# Patient Record
Sex: Female | Born: 1965 | Race: White | Hispanic: No | Marital: Married | Smoking: Never smoker
Health system: Southern US, Community
[De-identification: ages and names within clinical notes are randomized; demographics above are authoritative.]

## PROBLEM LIST (undated history)

## (undated) DIAGNOSIS — I890 Lymphedema, not elsewhere classified: Secondary | ICD-10-CM

## (undated) DIAGNOSIS — F988 Other specified behavioral and emotional disorders with onset usually occurring in childhood and adolescence: Secondary | ICD-10-CM

## (undated) DIAGNOSIS — M5430 Sciatica, unspecified side: Secondary | ICD-10-CM

## (undated) DIAGNOSIS — K859 Acute pancreatitis without necrosis or infection, unspecified: Secondary | ICD-10-CM

## (undated) DIAGNOSIS — I2699 Other pulmonary embolism without acute cor pulmonale: Secondary | ICD-10-CM

## (undated) DIAGNOSIS — G43909 Migraine, unspecified, not intractable, without status migrainosus: Secondary | ICD-10-CM

## (undated) DIAGNOSIS — F419 Anxiety disorder, unspecified: Secondary | ICD-10-CM

## (undated) DIAGNOSIS — G629 Polyneuropathy, unspecified: Secondary | ICD-10-CM

## (undated) DIAGNOSIS — E119 Type 2 diabetes mellitus without complications: Secondary | ICD-10-CM

## (undated) HISTORY — DX: Acute pancreatitis without necrosis or infection, unspecified: K85.90

## (undated) HISTORY — PX: PANCREATECTOMY: SHX1019

## (undated) HISTORY — PX: BREAST SURGERY: SHX581

## (undated) HISTORY — DX: Polyneuropathy, unspecified: G62.9

## (undated) HISTORY — PX: ABDOMINAL SURGERY: SHX537

## (undated) HISTORY — PX: SPLENECTOMY, TOTAL: SHX788

## (undated) HISTORY — PX: KIDNEY STONE SURGERY: SHX686

## (undated) HISTORY — DX: Migraine, unspecified, not intractable, without status migrainosus: G43.909

## (undated) HISTORY — PX: CHOLECYSTECTOMY: SHX55

## (undated) HISTORY — DX: Other pulmonary embolism without acute cor pulmonale: I26.99

---

## 2010-05-01 DIAGNOSIS — K859 Acute pancreatitis without necrosis or infection, unspecified: Secondary | ICD-10-CM

## 2010-05-01 HISTORY — DX: Acute pancreatitis without necrosis or infection, unspecified: K85.90

## 2010-10-01 DIAGNOSIS — G629 Polyneuropathy, unspecified: Secondary | ICD-10-CM

## 2010-10-01 DIAGNOSIS — I2699 Other pulmonary embolism without acute cor pulmonale: Secondary | ICD-10-CM

## 2010-10-01 HISTORY — DX: Polyneuropathy, unspecified: G62.9

## 2010-10-01 HISTORY — DX: Other pulmonary embolism without acute cor pulmonale: I26.99

## 2012-08-05 ENCOUNTER — Ambulatory Visit (INDEPENDENT_AMBULATORY_CARE_PROVIDER_SITE_OTHER): Payer: BC Managed Care – PPO | Admitting: Psychiatry

## 2012-08-05 ENCOUNTER — Encounter (HOSPITAL_COMMUNITY): Payer: Self-pay | Admitting: Psychiatry

## 2012-08-05 VITALS — BP 116/80 | HR 104 | Ht 60.0 in | Wt 170.0 lb

## 2012-08-05 DIAGNOSIS — F1920 Other psychoactive substance dependence, uncomplicated: Secondary | ICD-10-CM

## 2012-08-05 DIAGNOSIS — F411 Generalized anxiety disorder: Secondary | ICD-10-CM
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
## 2012-08-13 ENCOUNTER — Encounter (HOSPITAL_COMMUNITY): Payer: Self-pay | Admitting: Psychiatry

## 2012-08-13 DIAGNOSIS — F411 Generalized anxiety disorder: Secondary | ICD-10-CM | POA: Insufficient documentation

## 2012-08-13 DIAGNOSIS — F1920 Other psychoactive substance dependence, uncomplicated: Secondary | ICD-10-CM | POA: Insufficient documentation

## 2012-08-26 ENCOUNTER — Ambulatory Visit (INDEPENDENT_AMBULATORY_CARE_PROVIDER_SITE_OTHER): Payer: BC Managed Care – PPO | Admitting: Psychiatry

## 2012-08-26 ENCOUNTER — Encounter (HOSPITAL_COMMUNITY): Payer: Self-pay | Admitting: Psychiatry

## 2012-08-26 VITALS — BP 116/80 | HR 109 | Ht 60.0 in | Wt 170.0 lb

## 2012-08-26 DIAGNOSIS — F1920 Other psychoactive substance dependence, uncomplicated: Secondary | ICD-10-CM

## 2012-08-26 DIAGNOSIS — F1921 Other psychoactive substance dependence, in remission: Secondary | ICD-10-CM

## 2012-08-26 DIAGNOSIS — F411 Generalized anxiety disorder: Secondary | ICD-10-CM
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
## 2014-03-29 ENCOUNTER — Ambulatory Visit: Payer: BC Managed Care – PPO | Admitting: Physician Assistant

## 2015-09-14 ENCOUNTER — Emergency Department
Admission: EM | Admit: 2015-09-14 | Discharge: 2015-09-14 | Disposition: A | Discharge status: Home / Self Care | Payer: Medicare HMO | Source: Home / Self Care | Attending: Family Medicine | Admitting: Family Medicine

## 2015-09-14 ENCOUNTER — Emergency Department (INDEPENDENT_AMBULATORY_CARE_PROVIDER_SITE_OTHER): Payer: Medicare HMO

## 2015-09-14 ENCOUNTER — Ambulatory Visit (INDEPENDENT_AMBULATORY_CARE_PROVIDER_SITE_OTHER): Payer: Medicare HMO | Admitting: Family Medicine

## 2015-09-14 ENCOUNTER — Encounter: Payer: Self-pay | Admitting: *Deleted

## 2015-09-14 DIAGNOSIS — S8261XA Displaced fracture of lateral malleolus of right fibula, initial encounter for closed fracture: Secondary | ICD-10-CM

## 2015-09-14 DIAGNOSIS — S93401A Sprain of unspecified ligament of right ankle, initial encounter: Secondary | ICD-10-CM

## 2015-09-14 DIAGNOSIS — M7731 Calcaneal spur, right foot: Secondary | ICD-10-CM

## 2015-09-14 HISTORY — DX: Anxiety disorder, unspecified: F41.9

## 2015-09-14 HISTORY — DX: Lymphedema, not elsewhere classified: I89.0

## 2015-09-14 HISTORY — DX: Sciatica, unspecified side: M54.30

## 2015-09-14 HISTORY — DX: Other specified behavioral and emotional disorders with onset usually occurring in childhood and adolescence: F98.8

## 2015-09-14 HISTORY — DX: Type 2 diabetes mellitus without complications: E11.9

## 2015-09-14 NOTE — Patient Instructions (Signed)
Thank you for coming in today. Use the boot.  Return to me in about 1-2 weeks.  Use crutches as needed.   Take aleve for pain as needed.   Acute Ankle Sprain With Phase I Rehab An acute ankle sprain is a partial or complete tear in one or more of the ligaments of the ankle due to traumatic injury. The severity of the injury depends on both the number of ligaments sprained and the grade of sprain. There are 3 grades of sprains.   A grade 1 sprain is a mild sprain. There is a slight pull without obvious tearing. There is no loss of strength, and the muscle and ligament are the correct length.  A grade 2 sprain is a moderate sprain. There is tearing of fibers within the substance of the ligament where it connects two bones or two cartilages. The length of the ligament is increased, and there is usually decreased strength.  A grade 3 sprain is a complete rupture of the ligament and is uncommon. In addition to the grade of sprain, there are three types of ankle sprains.  Lateral ankle sprains: This is a sprain of one or more of the three ligaments on the outer side (lateral) of the ankle. These are the most common sprains. Medial ankle sprains: There is one large triangular ligament of the inner side (medial) of the ankle that is susceptible to injury. Medial ankle sprains are less common. Syndesmosis, "high ankle," sprains: The syndesmosis is the ligament that connects the two bones of the lower leg. Syndesmosis sprains usually only occur with very severe ankle sprains. SYMPTOMS  Pain, tenderness, and swelling in the ankle, starting at the side of injury that may progress to the whole ankle and foot with time.  "Pop" or tearing sensation at the time of injury.  Bruising that may spread to the heel.  Impaired ability to walk soon after injury. CAUSES   Acute ankle sprains are caused by trauma placed on the ankle that temporarily forces or pries the anklebone (talus) out of its normal  socket.  Stretching or tearing of the ligaments that normally hold the joint in place (usually due to a twisting injury). RISK INCREASES WITH:  Previous ankle sprain.  Sports in which the foot may land awkwardly (i.e., basketball, volleyball, or soccer) or walking or running on uneven or rough surfaces.  Shoes with inadequate support to prevent sideways motion when stress occurs.  Poor strength and flexibility.  Poor balance skills.  Contact sports. PREVENTION   Warm up and stretch properly before activity.  Maintain physical fitness:  Ankle and leg flexibility, muscle strength, and endurance.  Cardiovascular fitness.  Balance training activities.  Use proper technique and have a coach correct improper technique.  Taping, protective strapping, bracing, or high-top tennis shoes may help prevent injury. Initially, tape is best; however, it loses most of its support function within 10 to 15 minutes.  Wear proper-fitted protective shoes (High-top shoes with taping or bracing is more effective than either alone).  Provide the ankle with support during sports and practice activities for 12 months following injury. PROGNOSIS   If treated properly, ankle sprains can be expected to recover completely; however, the length of recovery depends on the degree of injury.  A grade 1 sprain usually heals enough in 5 to 7 days to allow modified activity and requires an average of 6 weeks to heal completely.  A grade 2 sprain requires 6 to 10 weeks to heal completely.  A grade 3 sprain requires 12 to 16 weeks to heal.  A syndesmosis sprain often takes more than 3 months to heal. RELATED COMPLICATIONS   Frequent recurrence of symptoms may result in a chronic problem. Appropriately addressing the problem the first time decreases the frequency of recurrence and optimizes healing time. Severity of the initial sprain does not predict the likelihood of later instability.  Injury to other  structures (bone, cartilage, or tendon).  A chronically unstable or arthritic ankle joint is a possibility with repeated sprains. TREATMENT Treatment initially involves the use of ice, medication, and compression bandages to help reduce pain and inflammation. Ankle sprains are usually immobilized in a walking cast or boot to allow for healing. Crutches may be recommended to reduce pressure on the injury. After immobilization, strengthening and stretching exercises may be necessary to regain strength and a full range of motion. Surgery is rarely needed to treat ankle sprains. MEDICATION   Nonsteroidal anti-inflammatory medications, such as aspirin and ibuprofen (do not take for the first 3 days after injury or within 7 days before surgery), or other minor pain relievers, such as acetaminophen, are often recommended. Take these as directed by your caregiver. Contact your caregiver immediately if any bleeding, stomach upset, or signs of an allergic reaction occur from these medications.  Ointments applied to the skin may be helpful.  Pain relievers may be prescribed as necessary by your caregiver. Do not take prescription pain medication for longer than 4 to 7 days. Use only as directed and only as much as you need. HEAT AND COLD  Cold treatment (icing) is used to relieve pain and reduce inflammation for acute and chronic cases. Cold should be applied for 10 to 15 minutes every 2 to 3 hours for inflammation and pain and immediately after any activity that aggravates your symptoms. Use ice packs or an ice massage.  Heat treatment may be used before performing stretching and strengthening activities prescribed by your caregiver. Use a heat pack or a warm soak. SEEK IMMEDIATE MEDICAL CARE IF:   Pain, swelling, or bruising worsens despite treatment.  You experience pain, numbness, discoloration, or coldness in the foot or toes.  New, unexplained symptoms develop (drugs used in treatment may produce  side effects.) EXERCISES  PHASE I EXERCISES RANGE OF MOTION (ROM) AND STRETCHING EXERCISES - Ankle Sprain, Acute Phase I, Weeks 1 to 2 These exercises may help you when beginning to restore flexibility in your ankle. You will likely work on these exercises for the 1 to 2 weeks after your injury. Once your physician, physical therapist, or athletic trainer sees adequate progress, he or she will advance your exercises. While completing these exercises, remember:   Restoring tissue flexibility helps normal motion to return to the joints. This allows healthier, less painful movement and activity.  An effective stretch should be held for at least 30 seconds.  A stretch should never be painful. You should only feel a gentle lengthening or release in the stretched tissue. RANGE OF MOTION - Dorsi/Plantar Flexion  While sitting with your right / left knee straight, draw the top of your foot upwards by flexing your ankle. Then reverse the motion, pointing your toes downward.  Hold each position for __________ seconds.  After completing your first set of exercises, repeat this exercise with your knee bent. Repeat __________ times. Complete this exercise __________ times per day.  RANGE OF MOTION - Ankle Alphabet  Imagine your right / left big toe is a pen.  Keeping  your hip and knee still, write out the entire alphabet with your "pen." Make the letters as large as you can without increasing any discomfort. Repeat __________ times. Complete this exercise __________ times per day.  STRENGTHENING EXERCISES - Ankle Sprain, Acute -Phase I, Weeks 1 to 2 These exercises may help you when beginning to restore strength in your ankle. You will likely work on these exercises for 1 to 2 weeks after your injury. Once your physician, physical therapist, or athletic trainer sees adequate progress, he or she will advance your exercises. While completing these exercises, remember:   Muscles can gain both the  endurance and the strength needed for everyday activities through controlled exercises.  Complete these exercises as instructed by your physician, physical therapist, or athletic trainer. Progress the resistance and repetitions only as guided.  You may experience muscle soreness or fatigue, but the pain or discomfort you are trying to eliminate should never worsen during these exercises. If this pain does worsen, stop and make certain you are following the directions exactly. If the pain is still present after adjustments, discontinue the exercise until you can discuss the trouble with your clinician. STRENGTH - Dorsiflexors  Secure a rubber exercise band/tubing to a fixed object (i.e., table, pole) and loop the other end around your right / left foot.  Sit on the floor facing the fixed object. The band/tubing should be slightly tense when your foot is relaxed.  Slowly draw your foot back toward you using your ankle and toes.  Hold this position for __________ seconds. Slowly release the tension in the band and return your foot to the starting position. Repeat __________ times. Complete this exercise __________ times per day.  STRENGTH - Plantar-flexors   Sit with your right / left leg extended. Holding onto both ends of a rubber exercise band/tubing, loop it around the ball of your foot. Keep a slight tension in the band.  Slowly push your toes away from you, pointing them downward.  Hold this position for __________ seconds. Return slowly, controlling the tension in the band/tubing. Repeat __________ times. Complete this exercise __________ times per day.  STRENGTH - Ankle Eversion  Secure one end of a rubber exercise band/tubing to a fixed object (table, pole). Loop the other end around your foot just before your toes.  Place your fists between your knees. This will focus your strengthening at your ankle.  Drawing the band/tubing across your opposite foot, slowly, pull your little toe  out and up. Make sure the band/tubing is positioned to resist the entire motion.  Hold this position for __________ seconds. Have your muscles resist the band/tubing as it slowly pulls your foot back to the starting position.  Repeat __________ times. Complete this exercise __________ times per day.  STRENGTH - Ankle Inversion  Secure one end of a rubber exercise band/tubing to a fixed object (table, pole). Loop the other end around your foot just before your toes.  Place your fists between your knees. This will focus your strengthening at your ankle.  Slowly, pull your big toe up and in, making sure the band/tubing is positioned to resist the entire motion.  Hold this position for __________ seconds.  Have your muscles resist the band/tubing as it slowly pulls your foot back to the starting position. Repeat __________ times. Complete this exercises __________ times per day.  STRENGTH - Towel Curls  Sit in a chair positioned on a non-carpeted surface.  Place your right / left foot on a towel,  keeping your heel on the floor.  Pull the towel toward your heel by only curling your toes. Keep your heel on the floor.  If instructed by your physician, physical therapist, or athletic trainer, add weight to the end of the towel. Repeat __________ times. Complete this exercise __________ times per day.   This information is not intended to replace advice given to you by your health care provider. Make sure you discuss any questions you have with your health care provider.   Document Released: 04/18/2005 Document Revised: 10/08/2014 Document Reviewed: 12/30/2008 Elsevier Interactive Patient Education [employer]

## 2015-09-14 NOTE — ED Provider Notes (Signed)
CSN: [id number]     Arrival date & time 09/14/15  [id number] History   First MD Initiated Contact with Patient 09/14/15 1009     Chief Complaint  Patient presents with  . Ankle Injury      HPI Comments: Patient tripped on two small stairs yesterday, injuring her right ankle.  She has had persistent pain/swelling.  Patient is a 49 y.o. female presenting with ankle pain. The history is provided by the patient.  Ankle Pain Location:  Ankle Time since incident:  1 day Injury: yes   Mechanism of injury: fall   Fall:    Fall occurred:  Down stairs   Impact surface:  Hard floor Ankle location:  R ankle Pain details:    Quality:  Aching   Radiates to:  R leg   Severity:  Moderate   Onset quality:  Sudden   Duration:  1 day   Timing:  Constant   Progression:  Unchanged Chronicity:  New Prior injury to area:  No Relieved by:  None tried Worsened by:  Bearing weight Ineffective treatments:  None tried Associated symptoms: decreased ROM, numbness, stiffness, swelling and tingling   Associated symptoms: no back pain and no muscle weakness     Past Medical History  Diagnosis Date  . Pancreatitis Aug 2011  . Migraines   . Neuropathy (HCC) 2012  . Pulmonary embolism (HCC) 10/2010  . Diabetes mellitus without complication (HCC)   . Anxiety   . Lymphedema   . ADD (attention deficit disorder)   . Sciatica    Past Surgical History  Procedure Laterality Date  . Pancreatectomy    . Abdominal surgery    . Kidney stone surgery    . Splenectomy, total    . Cholecystectomy    . Pancreatectomy      partial  . Breast surgery     Family History  Problem Relation Age of Onset  . Alcohol abuse Father   . Drug abuse Father   . Dementia Maternal Grandfather   . Diabetes Mellitus II Paternal Grandfather    Social History  Substance Use Topics  . Smoking status: Never Smoker   . Smokeless tobacco: None  . Alcohol Use: No     Comment: Last drink Aug. 24, 2011. She was drinking 2-3  bottles of wine a day.   OB History    No data available     Review of Systems  Musculoskeletal: Positive for stiffness. Negative for back pain.  All other systems reviewed and are negative.   Allergies  Acetaminophen  Home Medications   Prior to Admission medications   Medication Sig Start Date End Date Taking? Authorizing Provider  ALPRAZolam Prudy Feeler(XANAX) 1 MG tablet Take 1 mg by mouth Daily.  07/13/12  Yes Historical Provider, MD  amitriptyline (ELAVIL) 75 MG tablet Take 75 mg by mouth at bedtime.   Yes Historical Provider, MD  buprenorphine (SUBUTEX) 8 MG SUBL SL tablet Place under the tongue daily.   Yes Historical Provider, MD  CREON 12000 UNITS CPEP Take 1 capsule by mouth 3 (three) times daily with meals.  06/16/12  Yes Historical Provider, MD  metFORMIN (GLUCOPHAGE) 500 MG tablet Take by mouth 2 (two) times daily with a meal.   Yes Historical Provider, MD  ondansetron (ZOFRAN) 4 MG tablet Take 4 mg by mouth every 8 (eight) hours as needed for nausea or vomiting.   Yes Historical Provider, MD  pantoprazole (PROTONIX) 40 MG tablet  06/30/12  Yes Historical Provider,  MD  rizatriptan (MAXALT) 10 MG tablet Take 10 mg by mouth Daily. 07/18/12  Yes Historical Provider, MD   Meds Ordered and Administered this Visit  Medications - No data to display  BP 121/84 mmHg  Pulse 98  Temp(Src) 98 F (36.7 C) (Oral)  Resp 16  Ht 5' (1.524 m)  Wt 154 lb (69.854 kg)  BMI 30.08 kg/m2  SpO2 96%  LMP 08/31/2015 No data found.   Physical Exam  Constitutional: She is oriented to person, place, and time. She appears well-developed and well-nourished. No distress.  HENT:  Head: Normocephalic.  Eyes: Conjunctivae are normal. Pupils are equal, round, and reactive to light.  Pulmonary/Chest: No respiratory distress.  Musculoskeletal: She exhibits no edema.       Right ankle: She exhibits decreased range of motion and swelling. She exhibits no ecchymosis, no deformity, no laceration and normal  pulse. Tenderness. Lateral malleolus and medial malleolus tenderness found. No head of 5th metatarsal and no proximal fibula tenderness found. Achilles tendon normal.       Feet:  Neurological: She is alert and oriented to person, place, and time.  Skin: Skin is warm and dry.  Nursing note and vitals reviewed.   ED Course  Procedures  None  Imaging Review Dg Ankle Complete Right  09/14/2015  CLINICAL DATA:  Post fall last evening now with lateral foot and ankle pain. EXAM: RIGHT ANKLE - COMPLETE 3+ VIEW COMPARISON:  Right foot radiographs-earlier same day in excreted FINDINGS: There is a punctate (approximately 2 mm) ossicle adjacent to the distal end of the fibula which may represent a tiny minimally displaced avulsion fracture. This finding is associated with very mild soft tissue swelling about the lateral malleolus. No additional fractures identified. Joint spaces are preserved. The ankle mortise is preserved. A small ankle joint effusion is suspected. Incidentally noted os trigonum and peroneus. Small to moderate sized plantar calcaneal spur. IMPRESSION: 1. Tiny ossicle adjacent to the distal fibula may represent agent terminating tiny avulsive injury. Correlation point tenderness at this location is recommended. 2. Otherwise, no acute findings. 3. Small to moderate-sized plantar calcaneal spur. Electronically Signed   By: Simonne Come M.D.   On: 09/14/2015 10:37   Dg Foot Complete Right  09/14/2015  CLINICAL DATA:  Post fall last evening now with right lateral foot and ankle pain. EXAM: RIGHT FOOT COMPLETE - 3+ VIEW COMPARISON:  Right ankle radiographs - earlier same day FINDINGS: There is a well-defined ossicle adjacent to the distal end of the medial malleolus which is favored to be the sequela of remote avulsive injury. No definite acute fracture or dislocation. Mild hallux valgus deformity with associated mild degenerative change of the first MTP joint with joint space loss, subchondral  sclerosis osteophytosis. The remaining joint spaces appear preserved. No definite erosions. Incidentally noted os trigonum and peroneus. Small to moderate size plantar calcaneal spur. Regional soft tissues appear grossly normal. IMPRESSION: 1. No definite acute findings. 2. Mild hallux valgus deformity with associated mild degenerative change. Electronically Signed   By: Simonne Come M.D.   On: 09/14/2015 10:39      MDM   1. Avulsion fracture of lateral malleolus, right, closed, initial encounter   2. Right ankle sprain, initial encounter    Will arrange consultation with Dr. Clementeen Graham for fracture management and follow-up.    Lattie Haw, MD 09/14/15 [phone]

## 2015-09-14 NOTE — ED Notes (Signed)
Pt c/o RT ankle/foot pain x 1 day post fall in her garage at home. She took Advil at [id number] today.

## 2015-09-14 NOTE — Assessment & Plan Note (Signed)
Patient has a tiny avulsion fracture from her lateral malleolus.  We'll treat this with a cam walker boot. Additionally well treated with home exercises and ice and elevation and NSAIDs. Return to me in about 2 weeks.

## 2015-09-14 NOTE — Progress Notes (Signed)
Subjective:    I'm seeing this patient as a consultation for:  Dr. Cathren Harsh  CC: Right ankle injury  HPI: Patient tripped and fell yesterday and inverted her foot. She notes pain and swelling. She's tried ibuprofen which helps some. She notes pain and swelling and difficulty bearing weight. She was seen in urgent care today where she was diagnosed with avulsion fracture at the lateral malleolus. She denies any radiating pain weakness or numbness fevers or chills.  Past medical history, Surgical history, Family history not pertinant except as noted below, Social history, Allergies, and medications have been entered into the medical record, reviewed, and no changes needed.   Review of Systems: No headache, visual changes, nausea, vomiting, diarrhea, constipation, dizziness, abdominal pain, skin rash, fevers, chills, night sweats, weight loss, swollen lymph nodes, body aches, joint swelling, muscle aches, chest pain, shortness of breath, mood changes, visual or auditory hallucinations.   Objective:   BP 121/84 mmHg  Pulse 98  Temp(Src) 98 F (36.7 C) (Oral)  Resp 16  Ht 5' (1.524 m)  Wt 154 lb (69.854 kg)  BMI 30.08 kg/m2  SpO2 96%  LMP 08/31/2015 General: Well Developed, well nourished, and in no acute distress.  Neuro/Psych: Alert and oriented x3, extra-ocular muscles intact, able to move all 4 extremities, sensation grossly intact. Skin: Warm and dry, no rashes noted.  Respiratory: Not using accessory muscles, speaking in full sentences, trachea midline.  Cardiovascular: Pulses palpable, no extremity edema. Abdomen: Does not appear distended. MSK: Right foot swollen and the ankle and foot especially laterally. Tender palpation at the lateral malleolus. Pulses capillary refill sensation are intact distally. Normal ankle motion. Stable ligamentous exam.  No results found for this or any previous visit (from the past 24 hour(s)). Dg Ankle Complete Right  09/14/2015  CLINICAL DATA:   Post fall last evening now with lateral foot and ankle pain. EXAM: RIGHT ANKLE - COMPLETE 3+ VIEW COMPARISON:  Right foot radiographs-earlier same day in excreted FINDINGS: There is a punctate (approximately 2 mm) ossicle adjacent to the distal end of the fibula which may represent a tiny minimally displaced avulsion fracture. This finding is associated with very mild soft tissue swelling about the lateral malleolus. No additional fractures identified. Joint spaces are preserved. The ankle mortise is preserved. A small ankle joint effusion is suspected. Incidentally noted os trigonum and peroneus. Small to moderate sized plantar calcaneal spur. IMPRESSION: 1. Tiny ossicle adjacent to the distal fibula may represent agent terminating tiny avulsive injury. Correlation point tenderness at this location is recommended. 2. Otherwise, no acute findings. 3. Small to moderate-sized plantar calcaneal spur. Electronically Signed   By: Simonne Come M.D.   On: 09/14/2015 10:37   Dg Foot Complete Right  09/14/2015  CLINICAL DATA:  Post fall last evening now with right lateral foot and ankle pain. EXAM: RIGHT FOOT COMPLETE - 3+ VIEW COMPARISON:  Right ankle radiographs - earlier same day FINDINGS: There is a well-defined ossicle adjacent to the distal end of the medial malleolus which is favored to be the sequela of remote avulsive injury. No definite acute fracture or dislocation. Mild hallux valgus deformity with associated mild degenerative change of the first MTP joint with joint space loss, subchondral sclerosis osteophytosis. The remaining joint spaces appear preserved. No definite erosions. Incidentally noted os trigonum and peroneus. Small to moderate size plantar calcaneal spur. Regional soft tissues appear grossly normal. IMPRESSION: 1. No definite acute findings. 2. Mild hallux valgus deformity with associated mild degenerative change.  Electronically Signed   By: Simonne ComeJohn  Watts M.D.   On: 09/14/2015 10:39    Patient  was given a cam walker boot and was able to walk without significant pain.  Impression and Recommendations:   This case required medical decision making of moderate complexity.

## 2015-09-14 NOTE — Discharge Instructions (Signed)
Ankle Sprain  An ankle sprain is an injury to the strong, fibrous tissues (ligaments) that hold the bones of your ankle joint together.   CAUSES  An ankle sprain is usually caused by a fall or by twisting your ankle. Ankle sprains most commonly occur when you step on the outer edge of your foot, and your ankle turns inward. People who participate in sports are more prone to these types of injuries.   SYMPTOMS    Pain in your ankle. The pain may be present at rest or only when you are trying to stand or walk.   Swelling.   Bruising. Bruising may develop immediately or within 1 to 2 days after your injury.   Difficulty standing or walking, particularly when turning corners or changing directions.  DIAGNOSIS   Your caregiver will ask you details about your injury and perform a physical exam of your ankle to determine if you have an ankle sprain. During the physical exam, your caregiver will press on and apply pressure to specific areas of your foot and ankle. Your caregiver will try to move your ankle in certain ways. An X-ray exam may be done to be sure a bone was not broken or a ligament did not separate from one of the bones in your ankle (avulsion fracture).   TREATMENT   Certain types of braces can help stabilize your ankle. Your caregiver can make a recommendation for this. Your caregiver may recommend the use of medicine for pain. If your sprain is severe, your caregiver may refer you to a surgeon who helps to restore function to parts of your skeletal system (orthopedist) or a physical therapist.  HOME CARE INSTRUCTIONS    Apply ice to your injury for 1-2 days or as directed by your caregiver. Applying ice helps to reduce inflammation and pain.    Put ice in a plastic bag.    Place a towel between your skin and the bag.    Leave the ice on for 15-20 minutes at a time, every 2 hours while you are awake.   Only take over-the-counter or prescription medicines for pain, discomfort, or fever as directed by  your caregiver.   Elevate your injured ankle above the level of your heart as much as possible for 2-3 days.   If your caregiver recommends crutches, use them as instructed. Gradually put weight on the affected ankle. Continue to use crutches or a cane until you can walk without feeling pain in your ankle.   If you have a plaster splint, wear the splint as directed by your caregiver. Do not rest it on anything harder than a pillow for the first 24 hours. Do not put weight on it. Do not get it wet. You may take it off to take a shower or bath.   You may have been given an elastic bandage to wear around your ankle to provide support. If the elastic bandage is too tight (you have numbness or tingling in your foot or your foot becomes cold and blue), adjust the bandage to make it comfortable.   If you have an air splint, you may blow more air into it or let air out to make it more comfortable. You may take your splint off at night and before taking a shower or bath. Wiggle your toes in the splint several times per day to decrease swelling.  SEEK MEDICAL CARE IF:    You have rapidly increasing bruising or swelling.   Your toes feel   extremely cold or you lose feeling in your foot.   Your pain is not relieved with medicine.  SEEK IMMEDIATE MEDICAL CARE IF:   Your toes are numb or blue.   You have severe pain that is increasing.  MAKE SURE YOU:    Understand these instructions.   Will watch your condition.   Will get help right away if you are not doing well or get worse.     This information is not intended to replace advice given to you by your health care provider. Make sure you discuss any questions you have with your health care provider.     Document Released: 09/17/2005 Document Revised: 10/08/2014 Document Reviewed: 09/29/2011  [employer]vier Interactive Patient Education 2016 [employer]

## 2015-09-16 ENCOUNTER — Telehealth: Payer: Self-pay

## 2015-09-29 ENCOUNTER — Ambulatory Visit (INDEPENDENT_AMBULATORY_CARE_PROVIDER_SITE_OTHER): Payer: Medicare HMO | Admitting: Family Medicine

## 2015-09-29 ENCOUNTER — Ambulatory Visit (INDEPENDENT_AMBULATORY_CARE_PROVIDER_SITE_OTHER): Payer: Medicare HMO

## 2015-09-29 ENCOUNTER — Encounter: Payer: Self-pay | Admitting: Family Medicine

## 2015-09-29 VITALS — BP 110/74 | HR 77 | Wt 157.0 lb

## 2015-09-29 DIAGNOSIS — S300XXA Contusion of lower back and pelvis, initial encounter: Secondary | ICD-10-CM | POA: Diagnosis not present

## 2015-09-29 DIAGNOSIS — M533 Sacrococcygeal disorders, not elsewhere classified: Secondary | ICD-10-CM

## 2015-09-29 DIAGNOSIS — M25571 Pain in right ankle and joints of right foot: Secondary | ICD-10-CM

## 2015-09-29 DIAGNOSIS — S8261XA Displaced fracture of lateral malleolus of right fibula, initial encounter for closed fracture: Secondary | ICD-10-CM

## 2015-09-29 MED ORDER — NAPROXEN 500 MG PO TABS: 500.0000 mg | ORAL_TABLET | Freq: Two times a day (BID) | ORAL | Status: DC

## 2015-09-29 MED ORDER — DICLOFENAC SODIUM 1 % TD GEL: 2.0000 g | Freq: Four times a day (QID) | TRANSDERMAL | Status: DC

## 2015-09-29 NOTE — Progress Notes (Signed)
Charlotte Simpson is a 49 y.o. female who presents to [facility]: Primary Care today for follow-up right ankle avulsion fracture and new coccyx injury.  1) she was seen 2 weeks ago for small avulsion fracture to the right lateral ankle. She was placed into a cam walker boot. In the interim she feels great with nearly 0 pain. She is able to ambulate without the benefit. She's already started home range of motion exercises.  2) however patient slipped and fell landing on her buttocks yesterday  She fell down about 10 stairs. She has moderate coccyx pain. She denies any radiating pain weakness or numbness fevers or chills. She's tried ibuprofen which has not helped.  Of note patient has a history of polysubstance abuse and currently takes Suboxone.   Past Medical History  Diagnosis Date  . Pancreatitis Aug 2011  . Migraines   . Neuropathy (HCC) 2012  . Pulmonary embolism (HCC) 10/2010  . Diabetes mellitus without complication (HCC)   . Anxiety   . Lymphedema   . ADD (attention deficit disorder)   . Sciatica    Past Surgical History  Procedure Laterality Date  . Pancreatectomy    . Abdominal surgery    . Kidney stone surgery    . Splenectomy, total    . Cholecystectomy    . Pancreatectomy      partial  . Breast surgery     Social History  Substance Use Topics  . Smoking status: Never Smoker   . Smokeless tobacco: Not on file  . Alcohol Use: No     Comment: Last drink Aug. 24, 2011. She was drinking 2-3 bottles of wine a day.   family history includes Alcohol abuse in her father; Dementia in her maternal grandfather; Diabetes Mellitus II in her paternal grandfather; Drug abuse in her father.  ROS as above Medications: Current Outpatient Prescriptions  Medication Sig Dispense Refill  . ALPRAZolam (XANAX) 1 MG tablet Take 1 mg by mouth Daily.     Marland Kitchen amitriptyline (ELAVIL) 75 MG tablet Take  75 mg by mouth at bedtime.    . buprenorphine (SUBUTEX) 8 MG SUBL SL tablet Place under the tongue daily.    Marland Kitchen CREON 12000 UNITS CPEP Take 1 capsule by mouth 3 (three) times daily with meals.     . metFORMIN (GLUCOPHAGE) 500 MG tablet Take by mouth 2 (two) times daily with a meal.    . ondansetron (ZOFRAN) 4 MG tablet Take 4 mg by mouth every 8 (eight) hours as needed for nausea or vomiting.    . pantoprazole (PROTONIX) 40 MG tablet     . rizatriptan (MAXALT) 10 MG tablet Take 10 mg by mouth Daily.    . diclofenac sodium (VOLTAREN) 1 % GEL Apply 2 g topically 4 (four) times daily. 100 g 12  . naproxen (NAPROSYN) 500 MG tablet Take 1 tablet (500 mg total) by mouth 2 (two) times daily with a meal. 30 tablet 0   No current facility-administered medications for this visit.   Allergies  Allergen Reactions  . Acetaminophen      Exam:  BP 110/74 mmHg  Pulse 77  Wt 157 lb (71.215 kg)  LMP 08/31/2015 Gen: Well NAD Right ankle is normal-appearing. Nontender. Normal motion. Stable ligamentous exam. Pulses capillary refill and sensation intact. Spine: L-spine nontender. Tender palpation coccyx. Normal gait and motion.  No results found for this or any previous visit (from the past 24 hour(s)). Dg Ankle  Complete Right  09/29/2015  CLINICAL DATA:  Followup ankle fractures. EXAM: RIGHT ANKLE - COMPLETE 3+ VIEW COMPARISON:  09/14/2015 FINDINGS: The ankle mortise is maintained. Stable tiny densities projecting off the distal tips of the lateral and medial malleolus. These could be small avulsion fractures or due to remote trauma. No other acute bony findings. No osteochondral abnormality. Moderate size calcaneal heel spur. IMPRESSION: Stable ankle radiographs. Electronically Signed   By: Rudie MeyerP.  Gallerani M.D.   On: 09/29/2015 10:29   X-ray coccyx pending   Please see individual assessment and plan sections.

## 2015-09-29 NOTE — Assessment & Plan Note (Signed)
Doing very well. Transition to ASO brace. Work at home exercises. Recheck in a few weeks.

## 2015-09-29 NOTE — Progress Notes (Signed)
Quick Note:  No fracture of coccyx is seen ______

## 2015-09-29 NOTE — Assessment & Plan Note (Signed)
Awaiting x-rays to evaluate fracture. Treatment with diclofenac gel and oral naproxen. Follow-up in a few weeks.

## 2015-09-29 NOTE — Patient Instructions (Signed)
Thank you for coming in today.] Get xray pelvis.  Take oral naproxen.  Use topical voltaren gel.  Come back or go to the emergency room if you notice new weakness new numbness problems walking or bowel or bladder problems.  Acute Ankle Sprain With Phase I Rehab An acute ankle sprain is a partial or complete tear in one or more of the ligaments of the ankle due to traumatic injury. The severity of the injury depends on both the number of ligaments sprained and the grade of sprain. There are 3 grades of sprains.   A grade 1 sprain is a mild sprain. There is a slight pull without obvious tearing. There is no loss of strength, and the muscle and ligament are the correct length.  A grade 2 sprain is a moderate sprain. There is tearing of fibers within the substance of the ligament where it connects two bones or two cartilages. The length of the ligament is increased, and there is usually decreased strength.  A grade 3 sprain is a complete rupture of the ligament and is uncommon. In addition to the grade of sprain, there are three types of ankle sprains.  Lateral ankle sprains: This is a sprain of one or more of the three ligaments on the outer side (lateral) of the ankle. These are the most common sprains. Medial ankle sprains: There is one large triangular ligament of the inner side (medial) of the ankle that is susceptible to injury. Medial ankle sprains are less common. Syndesmosis, "high ankle," sprains: The syndesmosis is the ligament that connects the two bones of the lower leg. Syndesmosis sprains usually only occur with very severe ankle sprains. SYMPTOMS  Pain, tenderness, and swelling in the ankle, starting at the side of injury that may progress to the whole ankle and foot with time.  "Pop" or tearing sensation at the time of injury.  Bruising that may spread to the heel.  Impaired ability to walk soon after injury. CAUSES   Acute ankle sprains are caused by trauma placed on the  ankle that temporarily forces or pries the anklebone (talus) out of its normal socket.  Stretching or tearing of the ligaments that normally hold the joint in place (usually due to a twisting injury). RISK INCREASES WITH:  Previous ankle sprain.  Sports in which the foot may land awkwardly (i.e., basketball, volleyball, or soccer) or walking or running on uneven or rough surfaces.  Shoes with inadequate support to prevent sideways motion when stress occurs.  Poor strength and flexibility.  Poor balance skills.  Contact sports. PREVENTION   Warm up and stretch properly before activity.  Maintain physical fitness:  Ankle and leg flexibility, muscle strength, and endurance.  Cardiovascular fitness.  Balance training activities.  Use proper technique and have a coach correct improper technique.  Taping, protective strapping, bracing, or high-top tennis shoes may help prevent injury. Initially, tape is best; however, it loses most of its support function within 10 to 15 minutes.  Wear proper-fitted protective shoes (High-top shoes with taping or bracing is more effective than either alone).  Provide the ankle with support during sports and practice activities for 12 months following injury. PROGNOSIS   If treated properly, ankle sprains can be expected to recover completely; however, the length of recovery depends on the degree of injury.  A grade 1 sprain usually heals enough in 5 to 7 days to allow modified activity and requires an average of 6 weeks to heal completely.  A grade 2  sprain requires 6 to 10 weeks to heal completely.  A grade 3 sprain requires 12 to 16 weeks to heal.  A syndesmosis sprain often takes more than 3 months to heal. RELATED COMPLICATIONS   Frequent recurrence of symptoms may result in a chronic problem. Appropriately addressing the problem the first time decreases the frequency of recurrence and optimizes healing time. Severity of the initial  sprain does not predict the likelihood of later instability.  Injury to other structures (bone, cartilage, or tendon).  A chronically unstable or arthritic ankle joint is a possibility with repeated sprains. TREATMENT Treatment initially involves the use of ice, medication, and compression bandages to help reduce pain and inflammation. Ankle sprains are usually immobilized in a walking cast or boot to allow for healing. Crutches may be recommended to reduce pressure on the injury. After immobilization, strengthening and stretching exercises may be necessary to regain strength and a full range of motion. Surgery is rarely needed to treat ankle sprains. MEDICATION   Nonsteroidal anti-inflammatory medications, such as aspirin and ibuprofen (do not take for the first 3 days after injury or within 7 days before surgery), or other minor pain relievers, such as acetaminophen, are often recommended. Take these as directed by your caregiver. Contact your caregiver immediately if any bleeding, stomach upset, or signs of an allergic reaction occur from these medications.  Ointments applied to the skin may be helpful.  Pain relievers may be prescribed as necessary by your caregiver. Do not take prescription pain medication for longer than 4 to 7 days. Use only as directed and only as much as you need. HEAT AND COLD  Cold treatment (icing) is used to relieve pain and reduce inflammation for acute and chronic cases. Cold should be applied for 10 to 15 minutes every 2 to 3 hours for inflammation and pain and immediately after any activity that aggravates your symptoms. Use ice packs or an ice massage.  Heat treatment may be used before performing stretching and strengthening activities prescribed by your caregiver. Use a heat pack or a warm soak. SEEK IMMEDIATE MEDICAL CARE IF:   Pain, swelling, or bruising worsens despite treatment.  You experience pain, numbness, discoloration, or coldness in the foot or  toes.  New, unexplained symptoms develop (drugs used in treatment may produce side effects.) EXERCISES  PHASE I EXERCISES RANGE OF MOTION (ROM) AND STRETCHING EXERCISES - Ankle Sprain, Acute Phase I, Weeks 1 to 2 These exercises may help you when beginning to restore flexibility in your ankle. You will likely work on these exercises for the 1 to 2 weeks after your injury. Once your physician, physical therapist, or athletic trainer sees adequate progress, he or she will advance your exercises. While completing these exercises, remember:   Restoring tissue flexibility helps normal motion to return to the joints. This allows healthier, less painful movement and activity.  An effective stretch should be held for at least 30 seconds.  A stretch should never be painful. You should only feel a gentle lengthening or release in the stretched tissue. RANGE OF MOTION - Dorsi/Plantar Flexion  While sitting with your right / left knee straight, draw the top of your foot upwards by flexing your ankle. Then reverse the motion, pointing your toes downward.  Hold each position for __________ seconds.  After completing your first set of exercises, repeat this exercise with your knee bent. Repeat __________ times. Complete this exercise __________ times per day.  RANGE OF MOTION - Ankle Alphabet  Imagine your  right / left big toe is a pen.  Keeping your hip and knee still, write out the entire alphabet with your "pen." Make the letters as large as you can without increasing any discomfort. Repeat __________ times. Complete this exercise __________ times per day.  STRENGTHENING EXERCISES - Ankle Sprain, Acute -Phase I, Weeks 1 to 2 These exercises may help you when beginning to restore strength in your ankle. You will likely work on these exercises for 1 to 2 weeks after your injury. Once your physician, physical therapist, or athletic trainer sees adequate progress, he or she will advance your exercises.  While completing these exercises, remember:   Muscles can gain both the endurance and the strength needed for everyday activities through controlled exercises.  Complete these exercises as instructed by your physician, physical therapist, or athletic trainer. Progress the resistance and repetitions only as guided.  You may experience muscle soreness or fatigue, but the pain or discomfort you are trying to eliminate should never worsen during these exercises. If this pain does worsen, stop and make certain you are following the directions exactly. If the pain is still present after adjustments, discontinue the exercise until you can discuss the trouble with your clinician. STRENGTH - Dorsiflexors  Secure a rubber exercise band/tubing to a fixed object (i.e., table, pole) and loop the other end around your right / left foot.  Sit on the floor facing the fixed object. The band/tubing should be slightly tense when your foot is relaxed.  Slowly draw your foot back toward you using your ankle and toes.  Hold this position for __________ seconds. Slowly release the tension in the band and return your foot to the starting position. Repeat __________ times. Complete this exercise __________ times per day.  STRENGTH - Plantar-flexors   Sit with your right / left leg extended. Holding onto both ends of a rubber exercise band/tubing, loop it around the ball of your foot. Keep a slight tension in the band.  Slowly push your toes away from you, pointing them downward.  Hold this position for __________ seconds. Return slowly, controlling the tension in the band/tubing. Repeat __________ times. Complete this exercise __________ times per day.  STRENGTH - Ankle Eversion  Secure one end of a rubber exercise band/tubing to a fixed object (table, pole). Loop the other end around your foot just before your toes.  Place your fists between your knees. This will focus your strengthening at your  ankle.  Drawing the band/tubing across your opposite foot, slowly, pull your little toe out and up. Make sure the band/tubing is positioned to resist the entire motion.  Hold this position for __________ seconds. Have your muscles resist the band/tubing as it slowly pulls your foot back to the starting position.  Repeat __________ times. Complete this exercise __________ times per day.  STRENGTH - Ankle Inversion  Secure one end of a rubber exercise band/tubing to a fixed object (table, pole). Loop the other end around your foot just before your toes.  Place your fists between your knees. This will focus your strengthening at your ankle.  Slowly, pull your big toe up and in, making sure the band/tubing is positioned to resist the entire motion.  Hold this position for __________ seconds.  Have your muscles resist the band/tubing as it slowly pulls your foot back to the starting position. Repeat __________ times. Complete this exercises __________ times per day.  STRENGTH - Towel Curls  Sit in a chair positioned on a non-carpeted surface.  Place your right / left foot on a towel, keeping your heel on the floor.  Pull the towel toward your heel by only curling your toes. Keep your heel on the floor.  If instructed by your physician, physical therapist, or athletic trainer, add weight to the end of the towel. Repeat __________ times. Complete this exercise __________ times per day.   This information is not intended to replace advice given to you by your health care provider. Make sure you discuss any questions you have with your health care provider.   Document Released: 04/18/2005 Document Revised: 10/08/2014 Document Reviewed: 12/30/2008 [employer] Interactive Patient Education 2016 [employer]   Acute Ankle Sprain With Phase II Rehab An acute ankle sprain is a partial or complete tear in one or more of the ligaments of the ankle due to traumatic injury. The severity of the  injury depends on both the number of ligaments sprained and the grade of sprain. There are 3 grades of sprains.  A grade 1 sprain is a mild sprain. There is a slight pull without obvious tearing. There is no loss of strength, and the muscle and ligament are the correct length.  A grade 2 sprain is a moderate sprain. There is tearing of fibers within the substance of the ligament where it connects two bones or two cartilages. The length of the ligament is increased, and there is usually decreased strength.  A grade 3 sprain is a complete rupture of the ligament and is uncommon. In addition to the grade of sprain, there are 3 types of ankle sprains.  Lateral ankle sprains. This is a sprain of one or more of the 3 ligaments on the outer side (lateral) of the ankle. These are the most common sprains. Medial ankle sprains. There is one large triangular ligament on the inner side (medial) of the ankle that is susceptible to injury. Medial ankle sprains are less common. Syndesmosis, "high ankle," sprains. The syndesmosis is the ligament that connects the two bones of the lower leg. Syndesmosis sprains usually only occur with very severe ankle sprains. SYMPTOMS  Pain, tenderness, and swelling in the ankle, starting at the side of injury that may progress to the whole ankle and foot with time.  "Pop" or tearing sensation at the time of injury.  Bruising that may spread to the heel.  Impaired ability to walk soon after injury. CAUSES   Acute ankle sprains are caused by trauma placed on the ankle that temporarily forces or pries the anklebone (talus) out of its normal socket.  Stretching or tearing of the ligaments that normally hold the joint in place (usually due to a twisting injury). RISK INCREASES WITH:  Previous ankle sprain.  Sports in which the foot may land awkwardly (basketball, volleyball, soccer) or walking or running on uneven or rough surfaces.  Shoes with inadequate support to  prevent sideways motion when stress occurs.  Poor strength and flexibility.  Poor balance skills.  Contact sports. PREVENTION  Warm up and stretch properly before activity.  Maintain physical fitness:  Ankle and leg flexibility, muscle strength, and endurance.  Cardiovascular fitness.  Balance training activities.  Use proper technique and have a coach correct improper technique.  Taping, protective strapping, bracing, or high-top tennis shoes may help prevent injury. Initially, tape is best. However, it loses most of its support function within 10 to 15 minutes.  Wear proper fitted protective shoes. Combining high-top shoes with taping or bracing is more effective than using either alone.  Provide the ankle with support during sports and practice activities for 12 months following injury. PROGNOSIS   If treated properly, ankle sprains can be expected to recover completely. However, the length of recovery depends on the degree of injury.  A grade 1 sprain usually heals enough in 5 to 7 days to allow modified activity and requires an average of 6 weeks to heal completely.  A grade 2 sprain requires 6 to 10 weeks to heal completely.  A grade 3 sprain requires 12 to 16 weeks to heal.  A syndesmosis sprain often takes more than 3 months to heal. RELATED COMPLICATIONS   Frequent recurrence of symptoms may result in a chronic problem. Appropriately addressing the problem the first time decreases the frequency of recurrence and optimizes healing time. Severity of initial sprain does not predict the likelihood of later instability.  Injury to other structures (bone, cartilage, or tendon).  Chronically unstable or arthritic ankle joint are possible with repeated sprains. TREATMENT Treatment initially involves the use of ice, medicine, and compression bandages to help reduce pain and inflammation. Ankle sprains are usually immobilized in a walking cast or boot to allow for healing.  Crutches may be recommended to reduce pressure on the injury. After immobilization, strengthening and stretching exercises may be necessary to regain strength and a full range of motion. Surgery is rarely needed to treat ankle sprains. MEDICATION   Nonsteroidal anti-inflammatory medicines, such as aspirin and ibuprofen (do not take for the first 3 days after injury or within 7 days before surgery), or other minor pain relievers, such as acetaminophen, are often recommended. Take these as directed by your caregiver. Contact your caregiver immediately if any bleeding, stomach upset, or signs of an allergic reaction occur from these medicines.  Ointments applied to the skin may be helpful.  Pain relievers may be prescribed as necessary by your caregiver. Do not take prescription pain medicine for longer than 4 to 7 days. Use only as directed and only as much as you need. HEAT AND COLD  Cold treatment (icing) is used to relieve pain and reduce inflammation for acute and chronic cases. Cold should be applied for 10 to 15 minutes every 2 to 3 hours for inflammation and pain and immediately after any activity that aggravates your symptoms. Use ice packs or an ice massage.  Heat treatment may be used before performing stretching and strengthening activities prescribed by your caregiver. Use a heat pack or a warm soak. SEEK IMMEDIATE MEDICAL CARE IF:   Pain, swelling, or bruising worsens despite treatment.  You experience pain, numbness, discoloration, or coldness in the foot or toes.  New, unexplained symptoms develop. (Drugs used in treatment may produce side effects.) EXERCISES  PHASE II EXERCISES RANGE OF MOTION (ROM) AND STRETCHING EXERCISES - Ankle Sprain, Acute-Phase II, Weeks 3 to 4 After your physician, physical therapist, or athletic trainer feels your knee has made progress significant enough to begin more advanced exercises, he or she may recommend completing some of the following  exercises. Although each person heals at different rates, most people will be ready for these exercises between 3 and 4 weeks after their injury. Do not begin these exercises until you have your caregiver's permission. He or she may also advise you to continue with the exercises which you completed in Phase I of your rehabilitation. While completing these exercises, remember:   Restoring tissue flexibility helps normal motion to return to the joints. This allows healthier, less painful movement and activity.  An  effective stretch should be held for at least 30 seconds.  A stretch should never be painful. You should only feel a gentle lengthening or release in the stretched tissue. RANGE OF MOTION - Ankle Plantar Flexion   Sit with your right / left leg crossed over your opposite knee.  Use your opposite hand to pull the top of your foot and toes toward you.  You should feel a gentle stretch on the top of your foot/ankle. Hold this position for __________. Repeat __________ times. Complete __________ times per day.  RANGE OF MOTION - Ankle Eversion  Sit with your right / left ankle crossed over your opposite knee.  Grip your foot with your opposite hand, placing your thumb on the top of your foot and your fingers across the bottom of your foot.  Gently push your foot downward with a slight rotation so your littlest toes rise slightly  You should feel a gentle stretch on the inside of your ankle. Hold the stretch for __________ seconds. Repeat __________ times. Complete this exercise __________ times per day.  RANGE OF MOTION - Ankle Inversion  Sit with your right / left ankle crossed over your opposite knee.  Grip your foot with your opposite hand, placing your thumb on the bottom of your foot and your fingers across the top of your foot.  Gently pull your foot so the smallest toe comes toward you and your thumb pushes the inside of the ball of your foot away from you.  You should  feel a gentle stretch on the outside of your ankle. Hold the stretch for __________ seconds. Repeat __________ times. Complete this exercise __________ times per day.  STRETCH - Gastrocsoleus  Sit with your right / left leg extended. Holding onto both ends of a belt or towel, loop it around the ball of your foot.  Keeping your right / left ankle and foot relaxed and your knee straight, pull your foot and ankle toward you using the belt/towel.  You should feel a gentle stretch behind your calf or knee. Hold this position for __________ seconds. Repeat __________ times. Complete this stretch __________ times per day.  RANGE OF MOTION - Ankle Dorsiflexion, Active Assisted  Remove shoes and sit on a chair that is preferably not on a carpeted surface.  Place right / left foot under knee. Extend your opposite leg for support.  Keeping your heel down, slide your right / left foot back toward the chair until you feel a stretch at your ankle or calf. If you do not feel a stretch, slide your bottom forward to the edge of the chair while still keeping your heel down.  Hold this stretch for __________ seconds. Repeat __________ times. Complete this stretch __________ times per day.  STRETCH - Gastroc, Standing   Place hands on wall.  Extend right / left leg and place a folded washcloth under the arch of your foot for support. Keep the front knee somewhat bent.  Slightly point your toes inward on your back foot.  Keeping your right / left heel on the floor and your knee straight, shift your weight toward the wall, not allowing your back to arch.  You should feel a gentle stretch in the calf. Hold this position for __________ seconds. Repeat __________ times. Complete this stretch __________ times per day. STRETCH - Soleus, Standing  Place hands on wall.  Extend right / left leg and place a folded washcloth under the arch of your foot for support. Keep  the front knee somewhat bent.  Slightly  point your toes inward on your back foot.  Keep your right / left heel on the floor, bend your back knee, and slightly shift your weight over the back leg so that you feel a gentle stretch deep in your back calf.  Hold this position for __________ seconds. Repeat __________ times. Complete this stretch __________ times per day. STRETCH - Gastrocsoleus, Standing Note: This exercise can place a lot of stress on your foot and ankle. Please complete this exercise only if specifically instructed by your caregiver.   Place the ball of your right / left foot on a step, keeping your other foot firmly on the same step.  Hold on to the wall or a rail for balance.  Slowly lift your other foot, allowing your body weight to press your heel down over the edge of the step.  You should feel a stretch in your right / left calf.  Hold this position for __________ seconds.  Repeat this exercise with a slight bend in your knee. Repeat __________ times. Complete this stretch __________ times per day.  STRENGTHENING EXERCISES - Ankle Sprain, Acute-Phase II Around 3 to 4 weeks after your injury, you may progress to some of these exercises in your rehabilitation program. Do not begin these until you have your caregiver's permission. Although your condition has improved, the Phase I exercises will continue to be helpful and you may continue to complete them. As you complete strengthening exercises, remember:   Strong muscles with good endurance tolerate stress better.  Do the exercises as initially prescribed by your caregiver. Progress slowly with each exercise, gradually increasing the number of repetitions and weight used under his or her guidance.  You may experience muscle soreness or fatigue, but the pain or discomfort you are trying to eliminate should never worsen during these exercises. If this pain does worsen, stop and make certain you are following the directions exactly. If the pain is still present  after adjustments, discontinue the exercise until you can discuss the trouble with your caregiver. STRENGTH - Plantar-flexors, Standing  Stand with your feet shoulder width apart. Steady yourself with a wall or table using as little support as needed.  Keeping your weight evenly spread over the width of your feet, rise up on your toes.*  Hold this position for __________ seconds. Repeat __________ times. Complete this exercise __________ times per day.  *If this is too easy, shift your weight toward your right / left leg until you feel challenged. Ultimately, you may be asked to do this exercise with your right / left foot only. STRENGTH - Dorsiflexors and Plantar-flexors, Heel/toe Walking  Dorsiflexion: Walk on your heels only. Keep your toes as high as possible.  Walk for ____________________ seconds/feet.  Repeat __________ times. Complete __________ times per day.  Plantar flexion: Walk on your toes only. Keep your heels as high as possible.  Walk for ____________________ seconds/feet. Repeat __________ times. Complete __________ times per day.  BALANCE - Tandem Walking  Place your uninjured foot on a line 2 to 4 inches wide and at least 10 feet long.  Keeping your balance without using anything for extra support, place your right / left heel directly in front of your other foot.  Slowly raise your back foot up, lifting from the heel to the toes, and place it directly in front of the right / left foot.  Continue to walk along the line slowly. Walk for ____________________ feet. Repeat ____________________  times. Complete ____________________ times per day. BALANCE - Inversion/Eversion Use caution, these are advanced level exercises. Do not begin them until you are advised to do so.   Create a balance board using a sturdy board about 1  feet long and at 1 to 1  feet wide and a 1  inch diameter rod or pipe that is as long as the board's width. A copper pipe or a solid  broomstick work well.  Stand on a non-carpeted surface near a countertop or wall. Step onto the board so that your feet are hip-width apart and equally straddle the rod/pipe.  Keeping your feet in place, complete these two exercises without shifting your upper body or hips:  Tip the board from side-to-side. Control the movement so the board does not forcefully strike the ground. The board should silently tap the ground.  Tip the board side-to-side without striking the ground. Occasionally pause and maintain a steady position at various points.  Repeat the first two exercises, but use only your right / left foot. Place your right / left foot directly over the rod/pipe. Repeat __________ times. Complete this exercise __________ times a day. BALANCE - Plantar/Dorsi Flexion Use caution, these are advanced level exercises. Do not begin them until you are advised to do so.   Create a balance board using a sturdy board about 1  feet long and at 1 to 1  feet wide and a 1  inch diameter rod or pipe that is as long as the board's width. A copper pipe or a solid broomstick work well.  Stand on a non-carpeted surface near a countertop or wall. Stand on the board so that the rod/pipe runs under the arches in your feet.  Keeping your feet in place, complete these two exercises without shifting your upper body or hips:  Tip the board from side-to-side. Control the movement so the board does not forcefully strike the ground. The board should silently tap the ground.  Tip the board side-to-side without striking the ground. Occasionally pause and maintain a steady position at various points.  Repeat the first two exercises, but use only your right / left foot. Stand in the center of the board. Repeat __________ times. Complete this exercise __________ times a day. STRENGTH - Plantar-flexors, Eccentric Note: This exercise can place a lot of stress on your foot and ankle. Please complete this exercise  only if specifically instructed by your caregiver.   Place the balls of your feet on a step. With your hands, use only enough support from a wall or rail to keep your balance.  Keep your knees straight and rise up on your toes.  Slowly shift your weight entirely to your toes and pick up your opposite foot. Gently and with controlled movement, lower your weight through your right / left foot so that your heel drops below the level of the step. You will feel a slight stretch in the back of your calf at the ending position.  Use the healthy leg to help rise up onto the balls of both feet, then lower weight only on the right / left leg again. Build up to 15 repetitions. Then progress to 3 consecutive sets of 15 repetitions.*  After completing the above exercise, complete the same exercise with a slight knee bend (about 30 degrees). Again, build up to 15 repetitions. Then progress to 3 consecutive sets of 15 repetitions.* Perform this exercise __________ times per day.  *When you easily complete 3 sets of  15, your physician, physical therapist, or athletic trainer may advise you to add resistance by wearing a backpack filled with additional weight.   This information is not intended to replace advice given to you by your health care provider. Make sure you discuss any questions you have with your health care provider.   Document Released: 01/07/2006 Document Revised: 10/08/2014 Document Reviewed: 12/30/2008 Elsevier Interactive Patient Education [employer]

## 2016-11-12 ENCOUNTER — Ambulatory Visit (INDEPENDENT_AMBULATORY_CARE_PROVIDER_SITE_OTHER): Payer: Medicare HMO | Admitting: Family Medicine

## 2016-11-12 ENCOUNTER — Encounter: Payer: Self-pay | Admitting: *Deleted

## 2016-11-12 ENCOUNTER — Emergency Department (INDEPENDENT_AMBULATORY_CARE_PROVIDER_SITE_OTHER): Payer: Medicare HMO

## 2016-11-12 ENCOUNTER — Emergency Department
Admission: EM | Admit: 2016-11-12 | Discharge: 2016-11-12 | Disposition: A | Discharge status: Home / Self Care | Payer: Medicare HMO | Source: Home / Self Care | Attending: Family Medicine | Admitting: Family Medicine

## 2016-11-12 DIAGNOSIS — M79673 Pain in unspecified foot: Secondary | ICD-10-CM

## 2016-11-12 DIAGNOSIS — S92211A Displaced fracture of cuboid bone of right foot, initial encounter for closed fracture: Secondary | ICD-10-CM

## 2016-11-12 DIAGNOSIS — W19XXXA Unspecified fall, initial encounter: Secondary | ICD-10-CM | POA: Diagnosis not present

## 2016-11-12 DIAGNOSIS — M7731 Calcaneal spur, right foot: Secondary | ICD-10-CM

## 2016-11-12 MED ORDER — OXYCODONE HCL 5 MG PO CAPS
ORAL_CAPSULE | ORAL | 0 refills | Status: DC
Start: 2016-11-12 — End: 2016-11-28

## 2016-11-12 NOTE — ED Triage Notes (Signed)
Patient reports rolling her right foot and falling down 2 stairs 2 days ago. Pain and swelling to right foot and ankle. Previous fx to right foot.

## 2016-11-12 NOTE — Discharge Instructions (Signed)
Apply ice pack for 30 minutes every 1 to 2 hours today and tomorrow.  Elevate.  Use crutches until evaluated by Dr. Denyse Amassorey. Wear Ace wrap until swelling decreases.  May take Ibuprofen 200mg , 4 tabs every 8 hours with food.

## 2016-11-12 NOTE — ED Provider Notes (Signed)
[facility]    CSN: [id number] Arrival date & time: 11/12/16  1159     History   Chief Complaint Chief Complaint  Patient presents with  . Foot Injury    HPI Charlotte Simpson is a 51 y.o. female.   Patient reports that she twisted her right foot while walking down two stairs two days ago.  She had immediate pain/swelling on the dorsal/lateral aspect of her right foot.  She states that she has peripheral neuropathy which has caused her to become increasingly unsteady on her feet.   The history is provided by the patient.  Foot Injury  Location:  Foot Time since incident:  2 days Injury: yes   Mechanism of injury comment:  Inverted her foot on stairs Foot location:  R foot Pain details:    Quality:  Aching   Radiates to:  Does not radiate   Severity:  Moderate   Onset quality:  Sudden   Duration:  2 days   Timing:  Constant   Progression:  Unchanged Chronicity:  New Prior injury to area:  Yes Relieved by:  Nothing Worsened by:  Bearing weight Ineffective treatments:  Ice Associated symptoms: decreased ROM, numbness, stiffness and swelling   Associated symptoms: no back pain, no muscle weakness and no tingling   Risk factors comment:  Peripheral neuropathy   Past Medical History:  Diagnosis Date  . ADD (attention deficit disorder)   . Anxiety   . Diabetes mellitus without complication (HCC)   . Lymphedema   . Migraines   . Neuropathy (HCC) 2012  . Pancreatitis Aug 2011  . Pulmonary embolism (HCC) 10/2010  . Sciatica     Patient Active Problem List   Diagnosis Date Noted  . Coccyx contusion 09/29/2015  . Avulsion fracture of lateral malleolus of right fibula 09/14/2015  . Polysubstance dependence in full sustained remission 08/13/2012    Class: Diagnosis of  . Generalized anxiety disorder 08/13/2012    Past Surgical History:  Procedure Laterality Date  . ABDOMINAL SURGERY    . BREAST SURGERY    . CHOLECYSTECTOMY    . KIDNEY STONE SURGERY      . PANCREATECTOMY    . PANCREATECTOMY     partial  . SPLENECTOMY, TOTAL      OB History    No data available       Home Medications    Prior to Admission medications   Medication Sig Start Date End Date Taking? Authorizing Provider  ALPRAZolam Prudy Feeler) 1 MG tablet Take 1 mg by mouth Daily.  07/13/12  Yes Historical Provider, MD  amitriptyline (ELAVIL) 75 MG tablet Take 75 mg by mouth at bedtime.   Yes Historical Provider, MD  CREON 12000 UNITS CPEP Take 1 capsule by mouth 3 (three) times daily with meals.  06/16/12  Yes Historical Provider, MD  metFORMIN (GLUCOPHAGE) 500 MG tablet Take by mouth 2 (two) times daily with a meal.   Yes Historical Provider, MD  pantoprazole (PROTONIX) 40 MG tablet  06/30/12  Yes Historical Provider, MD  buprenorphine (SUBUTEX) 8 MG SUBL SL tablet Place under the tongue daily.    Historical Provider, MD  diclofenac sodium (VOLTAREN) 1 % GEL Apply 2 g topically 4 (four) times daily. 09/29/15   Rodolph Bong, MD  naproxen (NAPROSYN) 500 MG tablet Take 1 tablet (500 mg total) by mouth 2 (two) times daily with a meal. 09/29/15   Rodolph Bong, MD  oxycodone (OXY-IR) 5 MG capsule Take one tab  by mouth HS prn pain 11/12/16   Lattie Haw, MD  rizatriptan (MAXALT) 10 MG tablet Take 10 mg by mouth Daily. 07/18/12   Historical Provider, MD    Family History Family History  Problem Relation Age of Onset  . Alcohol abuse Father   . Drug abuse Father   . Dementia Maternal Grandfather   . Diabetes Mellitus II Paternal Grandfather     Social History Social History  Substance Use Topics  . Smoking status: Never Smoker  . Smokeless tobacco: Never Used  . Alcohol use No     Comment: Last drink Aug. 24, 2011. She was drinking 2-3 bottles of wine a day.     Allergies   Acetaminophen   Review of Systems Review of Systems  Musculoskeletal: Positive for stiffness. Negative for back pain.       Right foot pain  All other systems reviewed and are  negative.    Physical Exam Triage Vital Signs ED Triage Vitals  Enc Vitals Group     BP 11/12/16 1228 125/81     Pulse Rate 11/12/16 1228 111     Resp --      Temp --      Temp src --      SpO2 11/12/16 1228 97 %     Weight 11/12/16 1228 148 lb (67.1 kg)     Height --      Head Circumference --      Peak Flow --      Pain Score 11/12/16 1230 5     Pain Loc --      Pain Edu? --      Excl. in GC? --    No data found.   Updated Vital Signs BP 125/81 (BP Location: Left Arm)   Pulse 111   Wt 148 lb (67.1 kg)   LMP 10/29/2016   SpO2 97%   BMI 28.90 kg/m   Visual Acuity Right Eye Distance:   Left Eye Distance:   Bilateral Distance:    Right Eye Near:   Left Eye Near:    Bilateral Near:     Physical Exam  Constitutional: She appears well-developed and well-nourished. No distress.  HENT:  Head: Atraumatic.  Eyes: Conjunctivae are normal. Pupils are equal, round, and reactive to light.  Neck: Normal range of motion.  Cardiovascular: Normal rate.   Pulmonary/Chest: Effort normal.  Musculoskeletal: She exhibits no edema.       Right foot: There is decreased range of motion, tenderness, bony tenderness and swelling. There is normal capillary refill.       Feet:  Dorsum right foot is swollen and tender to palpation as noted on diagram.  Distal neurovascular function is intact.   Neurological: She is alert.  Skin: Skin is warm and dry.  Nursing note and vitals reviewed.    UC Treatments / Results  Labs (all labs ordered are listed, but only abnormal results are displayed) Labs Reviewed - No data to display  EKG  EKG Interpretation None       Radiology Dg Ankle Complete Right  Result Date: 11/12/2016 CLINICAL DATA:  Larey Seat last Friday, persistent pain and swelling at RIGHT ankle and RIGHT foot, history diabetes mellitus EXAM: RIGHT ANKLE - COMPLETE 3+ VIEW COMPARISON:  09/29/2015 FINDINGS: Mild osseous demineralization. Ankle mortise intact. Avulsion  fracture identified at the lateral margin of the distal cuboid bone, mildly displaced. This fracture at the fragment is not in evidence on the accompanying foot radiographs. Associated  soft tissue swelling at lateral foot. Tiny non fused ossicles adjacent to the malleoli. No additional fracture, dislocation, or bone destruction. Plantar calcaneal spur again seen. IMPRESSION: Avulsion fracture from lateral margin of the distal RIGHT cuboid adjacent to the fifth TMT joint. Electronically Signed   By: Ulyses SouthwardMark  Boles M.D.   On: 11/12/2016 13:05   Dg Foot Complete Right  Result Date: 11/12/2016 CLINICAL DATA:  Larey SeatFell last Friday, persistent pain and swelling at RIGHT ankle and RIGHT foot, history diabetes mellitus EXAM: RIGHT FOOT COMPLETE - 3+ VIEW COMPARISON:  09/14/2015 FINDINGS: Mild osseous demineralization. Hallux valgus with degenerative changes at first MTP joint. No acute fracture, dislocation, or bone destruction. Plantar calcaneal spur. Diffuse soft tissue swelling especially at dorsum of foot. IMPRESSION: Calcaneal spurring. Hallux valgus with degenerative changes first MTP joint. No acute abnormalities. Electronically Signed   By: Ulyses SouthwardMark  Boles M.D.   On: 11/12/2016 13:02    Procedures Procedures (including critical care time)  Medications Ordered in UC Medications - No data to display   Initial Impression / Assessment and Plan / UC Course  I have reviewed the triage vital signs and the nursing notes.  Pertinent labs & imaging results that were available during my care of the patient were reviewed by me and considered in my medical decision making (see chart for details).    Ace wrap applied.  Dispensed crutches. Rx for oxycodone 5mg  HS prn (patient cannot take acetaminophen). Discussed with Dr. Clementeen GrahamEvan Corey. Apply ice pack for 30 minutes every 1 to 2 hours today and tomorrow.  Elevate.  Use crutches until evaluated by Dr. Denyse Amassorey. Wear Ace wrap until swelling decreases.  May take Ibuprofen  200mg , 4 tabs every 8 hours with food.  Followup with Dr. Clementeen GrahamEvan Corey ([facility]) 11/19/16 at 10:30 am for fracture management.    Final Clinical Impressions(s) / UC Diagnoses   Final diagnoses:  Closed displaced fracture of cuboid of right foot, initial encounter    New Prescriptions New Prescriptions   OXYCODONE (OXY-IR) 5 MG CAPSULE    Take one tab by mouth HS prn pain     Lattie HawStephen A Lynanne Delgreco, MD 11/12/16 1358

## 2016-11-13 NOTE — Progress Notes (Signed)
Pt seen briefly in urgent care.  Plan to follow up next week.

## 2016-11-14 ENCOUNTER — Telehealth: Payer: Self-pay

## 2016-11-14 ENCOUNTER — Telehealth: Payer: Self-pay | Admitting: *Deleted

## 2016-11-14 NOTE — Telephone Encounter (Signed)
Pt called reports that the Oxycodone 5mg  is not helping. I advised her that Dr Cathren HarshBeese is out of the office until Friday and that she should call Dr Zollie Peeorey's office to see if he has any other recommendations or should she follow up sooner with him.

## 2016-11-14 NOTE — Telephone Encounter (Signed)
Please arrange for faster follow up with me. She needs to come in.

## 2016-11-14 NOTE — Telephone Encounter (Signed)
Oxycodone 5 mg is not helping with the foot pain. Please advise.

## 2016-11-15 NOTE — Telephone Encounter (Signed)
Patient advised and transferred to scheduling for appointment.

## 2016-11-16 ENCOUNTER — Ambulatory Visit (INDEPENDENT_AMBULATORY_CARE_PROVIDER_SITE_OTHER): Payer: Medicare HMO | Admitting: Family Medicine

## 2016-11-16 VITALS — BP 123/88 | HR 87 | Wt 160.0 lb

## 2016-11-16 DIAGNOSIS — S92901A Unspecified fracture of right foot, initial encounter for closed fracture: Secondary | ICD-10-CM

## 2016-11-16 DIAGNOSIS — S8261XA Displaced fracture of lateral malleolus of right fibula, initial encounter for closed fracture: Secondary | ICD-10-CM

## 2016-11-16 DIAGNOSIS — S300XXA Contusion of lower back and pelvis, initial encounter: Secondary | ICD-10-CM

## 2016-11-16 MED ORDER — DICLOFENAC SODIUM 1 % TD GEL: 2.0000 g | Freq: Four times a day (QID) | TRANSDERMAL | 12 refills | Status: DC

## 2016-11-16 MED ORDER — NAPROXEN 500 MG PO TABS: 500.0000 mg | ORAL_TABLET | Freq: Two times a day (BID) | ORAL | 0 refills | Status: DC

## 2016-11-16 NOTE — Progress Notes (Signed)
Charlotte Simpson is a 51 y.o. female who presents to [facility] today for foot pain.   Patient suffered a fracture to her right lateral midfoot earlier this week. She was seen in urgent care on February 12. She was too swollen and uncomfortable to tolerate Cam Walker was given crutches. She notes continued pain especially with foot motion. She's been taking oxycodone was prescribed found to be not very effective. She currently has a prescription for Suboxone as prescribed by addiction medicine doctor. She feels well otherwise with no fevers or chills. She notes that she has trouble balancing with crutches and has been using a walker which seems to be more stable.   Past Medical History:  Diagnosis Date  . ADD (attention deficit disorder)   . Anxiety   . Diabetes mellitus without complication (HCC)   . Lymphedema   . Migraines   . Neuropathy (HCC) 2012  . Pancreatitis Aug 2011  . Pulmonary embolism (HCC) 10/2010  . Sciatica    Past Surgical History:  Procedure Laterality Date  . ABDOMINAL SURGERY    . BREAST SURGERY    . CHOLECYSTECTOMY    . KIDNEY STONE SURGERY    . PANCREATECTOMY    . PANCREATECTOMY     partial  . SPLENECTOMY, TOTAL     Social History  Substance Use Topics  . Smoking status: Never Smoker  . Smokeless tobacco: Never Used  . Alcohol use No     Comment: Last drink Aug. 24, 2011. She was drinking 2-3 bottles of wine a day.   family history includes Alcohol abuse in her father; Dementia in her maternal grandfather; Diabetes Mellitus II in her paternal grandfather; Drug abuse in her father.  ROS:  No headache, visual changes, nausea, vomiting, diarrhea, constipation, dizziness, abdominal pain, skin rash, fevers, chills, night sweats, weight loss, swollen lymph nodes, body aches, joint swelling, muscle aches, chest pain, shortness of breath, mood changes, visual or auditory hallucinations.    Medications: Current  Outpatient Prescriptions  Medication Sig Dispense Refill  . ALPRAZolam (XANAX) 1 MG tablet Take 1 mg by mouth Daily.     Marland Kitchen amitriptyline (ELAVIL) 75 MG tablet Take 75 mg by mouth at bedtime.    . buprenorphine (SUBUTEX) 8 MG SUBL SL tablet Place under the tongue daily.    Marland Kitchen CREON 12000 UNITS CPEP Take 1 capsule by mouth 3 (three) times daily with meals.     . diclofenac sodium (VOLTAREN) 1 % GEL Apply 2 g topically 4 (four) times daily. 100 g 12  . metFORMIN (GLUCOPHAGE) 500 MG tablet Take by mouth 2 (two) times daily with a meal.    . naproxen (NAPROSYN) 500 MG tablet Take 1 tablet (500 mg total) by mouth 2 (two) times daily with a meal. 30 tablet 0  . oxycodone (OXY-IR) 5 MG capsule Take one tab by mouth HS prn pain 10 capsule 0  . pantoprazole (PROTONIX) 40 MG tablet     . rizatriptan (MAXALT) 10 MG tablet Take 10 mg by mouth Daily.     No current facility-administered medications for this visit.    Allergies  Allergen Reactions  . Acetaminophen      Exam:  BP 123/88   Pulse 87   Wt 160 lb (72.6 kg)   LMP 10/29/2016   BMI 31.25 kg/m  General: Well Developed, well nourished, and in no acute distress.  Neuro/Psych: Alert and oriented x3, extra-ocular muscles intact, able to move all 4  extremities, sensation grossly intact. Skin: Warm and dry, no rashes noted.  Respiratory: Not using accessory muscles, speaking in full sentences, trachea midline.  Cardiovascular: Pulses palpable, no extremity edema. Abdomen: Does not appear distended. MSK: Right foot swollen ecchymosis and tender across the lateral midfoot. Pulses capillary refill and sensation intact distally.  Patient was placed in a cam walker boot and was able to ambulate without any significant pain.    No results found for this or any previous visit (from the past 48 hour(s)). Dg Ankle Complete Right  Result Date: 11/12/2016 CLINICAL DATA:  Larey SeatFell last Friday, persistent pain and swelling at RIGHT ankle and RIGHT foot,  history diabetes mellitus EXAM: RIGHT ANKLE - COMPLETE 3+ VIEW COMPARISON:  09/29/2015 FINDINGS: Mild osseous demineralization. Ankle mortise intact. Avulsion fracture identified at the lateral margin of the distal cuboid bone, mildly displaced. This fracture at the fragment is not in evidence on the accompanying foot radiographs. Associated soft tissue swelling at lateral foot. Tiny non fused ossicles adjacent to the malleoli. No additional fracture, dislocation, or bone destruction. Plantar calcaneal spur again seen. IMPRESSION: Avulsion fracture from lateral margin of the distal RIGHT cuboid adjacent to the fifth TMT joint. Electronically Signed   By: Ulyses SouthwardMark  Boles M.D.   On: 11/12/2016 13:05   Dg Foot Complete Right  Result Date: 11/12/2016 CLINICAL DATA:  Larey SeatFell last Friday, persistent pain and swelling at RIGHT ankle and RIGHT foot, history diabetes mellitus EXAM: RIGHT FOOT COMPLETE - 3+ VIEW COMPARISON:  09/14/2015 FINDINGS: Mild osseous demineralization. Hallux valgus with degenerative changes at first MTP joint. No acute fracture, dislocation, or bone destruction. Plantar calcaneal spur. Diffuse soft tissue swelling especially at dorsum of foot. IMPRESSION: Calcaneal spurring. Hallux valgus with degenerative changes first MTP joint. No acute abnormalities. Electronically Signed   By: Ulyses SouthwardMark  Boles M.D.   On: 11/12/2016 13:02      Assessment and Plan: 51 y.o. female with right foot: Continue fracture. Treat with cam walker boot. Use oral naproxen and topical diclofenac for pain control. Avoid opiates given history of substance abuse and current use with Suboxone.  Recheck in 1-2 weeks.    No orders of the defined types were placed in this encounter.   Discussed warning signs or symptoms. Please see discharge instructions. Patient expresses understanding.

## 2016-11-16 NOTE — Patient Instructions (Signed)
Thank you for coming in today. Use the cam walker most of the time.  You can take it off to bathe. Use crutches or a walker as needed. Use naproxen twice daily and voltern gel up to 4x daily as needed.  Return in 1-2 weeks guided by pain.  Return sooner if needed.

## 2016-11-19 ENCOUNTER — Ambulatory Visit: Payer: Self-pay | Admitting: Family Medicine

## 2016-11-28 ENCOUNTER — Ambulatory Visit (INDEPENDENT_AMBULATORY_CARE_PROVIDER_SITE_OTHER): Payer: Medicare HMO

## 2016-11-28 ENCOUNTER — Ambulatory Visit (INDEPENDENT_AMBULATORY_CARE_PROVIDER_SITE_OTHER): Payer: Medicare HMO | Admitting: Family Medicine

## 2016-11-28 ENCOUNTER — Encounter: Payer: Self-pay | Admitting: Family Medicine

## 2016-11-28 VITALS — BP 131/92 | HR 100

## 2016-11-28 DIAGNOSIS — W19XXXD Unspecified fall, subsequent encounter: Secondary | ICD-10-CM | POA: Diagnosis not present

## 2016-11-28 DIAGNOSIS — S92901A Unspecified fracture of right foot, initial encounter for closed fracture: Secondary | ICD-10-CM | POA: Diagnosis not present

## 2016-11-28 DIAGNOSIS — S92211D Displaced fracture of cuboid bone of right foot, subsequent encounter for fracture with routine healing: Secondary | ICD-10-CM | POA: Diagnosis not present

## 2016-11-28 DIAGNOSIS — M7731 Calcaneal spur, right foot: Secondary | ICD-10-CM

## 2016-11-28 NOTE — Progress Notes (Signed)
Charlotte Simpson is a 51 y.o. female who [facility] today for right foot pain.   Patient was seen originally February 12 4 avulsion fracture of the right lateral mid foot. She was treated with a cam walker boot and in arch strap and is feeling much better. She is able to ambulate with only minimal pain. She's been using naproxen intermittently for pain which works well.   Past Medical History:  Diagnosis Date  . ADD (attention deficit disorder)   . Anxiety   . Diabetes mellitus without complication (HCC)   . Lymphedema   . Migraines   . Neuropathy (HCC) 2012  . Pancreatitis Aug 2011  . Pulmonary embolism (HCC) 10/2010  . Sciatica    Past Surgical History:  Procedure Laterality Date  . ABDOMINAL SURGERY    . BREAST SURGERY    . CHOLECYSTECTOMY    . KIDNEY STONE SURGERY    . PANCREATECTOMY    . PANCREATECTOMY     partial  . SPLENECTOMY, TOTAL     Social History  Substance Use Topics  . Smoking status: Never Smoker  . Smokeless tobacco: Never Used  . Alcohol use No     Comment: Last drink Aug. 24, 2011. She was drinking 2-3 bottles of wine a day.     ROS:  As above   Medications: Current Outpatient Prescriptions  Medication Sig Dispense Refill  . ALPRAZolam (XANAX) 1 MG tablet Take 1 mg by mouth Daily.     Marland Kitchen. amitriptyline (ELAVIL) 75 MG tablet Take 75 mg by mouth at bedtime.    . buprenorphine (SUBUTEX) 8 MG SUBL SL tablet Place under the tongue daily.    Marland Kitchen. CREON 12000 UNITS CPEP Take 1 capsule by mouth 3 (three) times daily with meals.     . diclofenac sodium (VOLTAREN) 1 % GEL Apply 2 g topically 4 (four) times daily. 100 g 12  . metFORMIN (GLUCOPHAGE) 500 MG tablet Take by mouth 2 (two) times daily with a meal.    . naproxen (NAPROSYN) 500 MG tablet Take 1 tablet (500 mg total) by mouth 2 (two) times daily with a meal. 30 tablet 0  . pantoprazole (PROTONIX) 40 MG tablet     . rizatriptan (MAXALT) 10 MG tablet  Take 10 mg by mouth Daily.     No current facility-administered medications for this visit.    Allergies  Allergen Reactions  . Acetaminophen      Exam:  BP (!) 131/92   Pulse 100   LMP 10/29/2016  General: Well Developed, well nourished, and in no acute distress.  Neuro/Psych: Alert and oriented x3, extra-ocular muscles intact, able to move all 4 extremities, sensation grossly intact. Skin: Warm and dry, no rashes noted.  Respiratory: Not using accessory muscles, speaking in full sentences, trachea midline.  Cardiovascular: Pulses palpable, no extremity edema. Abdomen: Does not appear distended. MSK: Right foot: No ecchymosis. Mildly tender mid lateral foot. Normal motion pulses capillary refill and sensation.    No results found for this or any previous visit (from the past 48 hour(s)). Dg Foot Complete Right  Result Date: 11/28/2016 CLINICAL DATA:  Follow-up right foot fracture EXAM: RIGHT FOOT COMPLETE - 3+ VIEW COMPARISON:  11/12/2016 FINDINGS: Three views of the right foot submitted. Again noted tiny avulsion fracture lateral distal aspect of the right cuboid adjacent to fifth tarsal metatarsal joint best seen on lateral view. The bony fragment appears well corticated probable healing effect. Plantar spur  of calcaneus again noted. Again noted hallux valgus deformity. Stable degenerative changes first metatarsal phalangeal joint. IMPRESSION: Again noted tiny avulsion fracture lateral distal aspect of the right cuboid adjacent to fifth tarsal metatarsal joint best seen on lateral view. The bony fragment appears well corticated probable healing effect. Plantar spur of calcaneus again noted. Again noted hallux valgus deformity. Stable degenerative changes first metatarsal phalangeal joint. Electronically Signed   By: Natasha Mead M.D.   On: 11/28/2016 12:29      Assessment and Plan: 51 y.o. female with fracture. Doing well. Continue foot compression with cam walker boot. Recheck in 2  weeks.    Orders Placed This Encounter  Procedures  . DG Foot Complete Right    Standing Status:   Future    Number of Occurrences:   1    Standing Expiration Date:   01/26/2018    Order Specific Question:   Reason for Exam (SYMPTOM  OR DIAGNOSIS REQUIRED)    Answer:   eval fx    Order Specific Question:   Is patient pregnant?    Answer:   No    Order Specific Question:   Preferred imaging location?    Answer:   [facility]    Discussed warning signs or symptoms. Please see discharge instructions. Patient expresses understanding.

## 2016-11-28 NOTE — Patient Instructions (Signed)
Thank you for coming in today. Get your xray today.  We will contact you with results.   Use the foot compression.   Recheck in 2 weeks.

## 2016-12-12 ENCOUNTER — Ambulatory Visit (INDEPENDENT_AMBULATORY_CARE_PROVIDER_SITE_OTHER): Payer: Medicare HMO | Admitting: Family Medicine

## 2016-12-12 VITALS — BP 121/82 | HR 82 | Wt 166.0 lb

## 2016-12-12 DIAGNOSIS — S92901A Unspecified fracture of right foot, initial encounter for closed fracture: Secondary | ICD-10-CM | POA: Diagnosis not present

## 2016-12-12 NOTE — Progress Notes (Signed)
   Charlotte Simpson is a 51 y.o. female who [facility] today for follow-up right foot avulsion fracture. Patient has been seen several times for avulsion of the dorsal right lateral midfoot. She's been treated with an arch straps and cam walker boot. She is doing quite well and notes significant improvement in symptoms. She is nearly pain-free. She has tried walking without the boot and feels reasonably well.   Past Medical History:  Diagnosis Date  . ADD (attention deficit disorder)   . Anxiety   . Diabetes mellitus without complication (HCC)   . Lymphedema   . Migraines   . Neuropathy (HCC) 2012  . Pancreatitis Aug 2011  . Pulmonary embolism (HCC) 10/2010  . Sciatica    Past Surgical History:  Procedure Laterality Date  . ABDOMINAL SURGERY    . BREAST SURGERY    . CHOLECYSTECTOMY    . KIDNEY STONE SURGERY    . PANCREATECTOMY    . PANCREATECTOMY     partial  . SPLENECTOMY, TOTAL     Social History  Substance Use Topics  . Smoking status: Never Smoker  . Smokeless tobacco: Never Used  . Alcohol use No     Comment: Last drink Aug. 24, 2011. She was drinking 2-3 bottles of wine a day.     ROS:  As above   Medications: Current Outpatient Prescriptions  Medication Sig Dispense Refill  . ALPRAZolam (XANAX) 1 MG tablet Take 1 mg by mouth Daily.     Marland Kitchen. amitriptyline (ELAVIL) 75 MG tablet Take 75 mg by mouth at bedtime.    . buprenorphine (SUBUTEX) 8 MG SUBL SL tablet Place under the tongue daily.    Marland Kitchen. CREON 12000 UNITS CPEP Take 1 capsule by mouth 3 (three) times daily with meals.     . diclofenac sodium (VOLTAREN) 1 % GEL Apply 2 g topically 4 (four) times daily. 100 g 12  . metFORMIN (GLUCOPHAGE) 500 MG tablet Take by mouth 2 (two) times daily with a meal.    . naproxen (NAPROSYN) 500 MG tablet Take 1 tablet (500 mg total) by mouth 2 (two) times daily with a meal. 30 tablet 0  . pantoprazole (PROTONIX) 40 MG tablet       . rizatriptan (MAXALT) 10 MG tablet Take 10 mg by mouth Daily.     No current facility-administered medications for this visit.    Allergies  Allergen Reactions  . Acetaminophen      Exam:  BP 121/82   Pulse 82   Wt 166 lb (75.3 kg)   BMI 32.42 kg/m  General: Well Developed, well nourished, and in no acute distress.  Neuro/Psych: Alert and oriented x3, extra-ocular muscles intact, able to move all 4 extremities, sensation grossly intact. Skin: Warm and dry, no rashes noted.  Respiratory: Not using accessory muscles, speaking in full sentences, trachea midline.  Cardiovascular: Pulses palpable, no extremity edema. Abdomen: Does not appear distended. MSK: Right foot normal-appearing nontender normal motion and strength.    No results found for this or any previous visit (from the past 48 hour(s)). No results found.    Assessment and Plan: 51 y.o. female with improving right dorsal mid foot avulsion fracture. Plan to wean out of boot continue arch straps and arch support. Recheck in 2 weeks.    No orders of the defined types were placed in this encounter.   Discussed warning signs or symptoms. Please see discharge instructions. Patient expresses understanding.

## 2016-12-12 NOTE — Patient Instructions (Signed)
Thank you for coming in today. Recheck in 2 weeks.  Work on weaning out of the Personal assistantcam walker boot.  Continue arch strap and arch support.  Running or walking shoes are your best bet.  Let pain be your guide.

## 2016-12-26 ENCOUNTER — Ambulatory Visit (INDEPENDENT_AMBULATORY_CARE_PROVIDER_SITE_OTHER): Payer: Medicare HMO | Admitting: Family Medicine

## 2016-12-26 ENCOUNTER — Encounter: Payer: Self-pay | Admitting: Family Medicine

## 2016-12-26 ENCOUNTER — Ambulatory Visit: Payer: Medicare HMO | Admitting: Family Medicine

## 2016-12-26 VITALS — BP 129/81 | HR 90 | Wt 164.0 lb

## 2016-12-26 DIAGNOSIS — S92901A Unspecified fracture of right foot, initial encounter for closed fracture: Secondary | ICD-10-CM | POA: Diagnosis not present

## 2016-12-26 DIAGNOSIS — S8261XA Displaced fracture of lateral malleolus of right fibula, initial encounter for closed fracture: Secondary | ICD-10-CM

## 2016-12-26 MED ORDER — NAPROXEN 500 MG PO TABS: 500.0000 mg | ORAL_TABLET | Freq: Two times a day (BID) | ORAL | 0 refills | Status: DC

## 2016-12-26 MED ORDER — DICLOFENAC SODIUM 1 % TD GEL: 2.0000 g | Freq: Four times a day (QID) | TRANSDERMAL | 12 refills | Status: AC

## 2016-12-26 NOTE — Patient Instructions (Addendum)
Thank you for coming in today. Return in 1-2 months if still hurting.  Use the arch strap.  Return as needed.  Use the Voltaren and naproxen as needed.

## 2016-12-26 NOTE — Progress Notes (Signed)
   Charlotte Simpson is a 51 y.o. female who [facility] today for follow-up right foot avulsion fracture. Patient is doing well and is essentially pain-free with arch straps. She is back to her normal activity.   Past Medical History:  Diagnosis Date  . ADD (attention deficit disorder)   . Anxiety   . Diabetes mellitus without complication (HCC)   . Lymphedema   . Migraines   . Neuropathy (HCC) 2012  . Pancreatitis Aug 2011  . Pulmonary embolism (HCC) 10/2010  . Sciatica    Past Surgical History:  Procedure Laterality Date  . ABDOMINAL SURGERY    . BREAST SURGERY    . CHOLECYSTECTOMY    . KIDNEY STONE SURGERY    . PANCREATECTOMY    . PANCREATECTOMY     partial  . SPLENECTOMY, TOTAL     Social History  Substance Use Topics  . Smoking status: Never Smoker  . Smokeless tobacco: Never Used  . Alcohol use No     Comment: Last drink Aug. 24, 2011. She was drinking 2-3 bottles of wine a day.     ROS:  As above   Medications: Current Outpatient Prescriptions  Medication Sig Dispense Refill  . ALPRAZolam (XANAX) 1 MG tablet Take 1 mg by mouth Daily.     Marland Kitchen. amitriptyline (ELAVIL) 75 MG tablet Take 75 mg by mouth at bedtime.    . buprenorphine (SUBUTEX) 8 MG SUBL SL tablet Place under the tongue daily.    Marland Kitchen. CREON 12000 UNITS CPEP Take 1 capsule by mouth 3 (three) times daily with meals.     . diclofenac sodium (VOLTAREN) 1 % GEL Apply 2 g topically 4 (four) times daily. 100 g 12  . metFORMIN (GLUCOPHAGE) 500 MG tablet Take by mouth 2 (two) times daily with a meal.    . naproxen (NAPROSYN) 500 MG tablet Take 1 tablet (500 mg total) by mouth 2 (two) times daily with a meal. 30 tablet 0  . pantoprazole (PROTONIX) 40 MG tablet     . rizatriptan (MAXALT) 10 MG tablet Take 10 mg by mouth Daily.     No current facility-administered medications for this visit.    Allergies  Allergen Reactions  . Acetaminophen      Exam:  BP  129/81   Pulse 90   Wt 164 lb (74.4 kg)   BMI 32.03 kg/m  General: Well Developed, well nourished, and in no acute distress.  Neuro/Psych: Alert and oriented x3, extra-ocular muscles intact, able to move all 4 extremities, sensation grossly intact. Skin: Warm and dry, no rashes noted.  Respiratory: Not using accessory muscles, speaking in full sentences, trachea midline.  Cardiovascular: Pulses palpable, no extremity edema. Abdomen: Does not appear distended. MSK: Right foot normal appearing and non-tender    No results found for this or any previous visit (from the past 48 hour(s)). No results found.    Assessment and Plan: 51 y.o. female with doing well. Continue arch As tolerated. Recheck in 1-2 months if still hurting. Otherwise return as needed.    No orders of the defined types were placed in this encounter.   Discussed warning signs or symptoms. Please see discharge instructions. Patient expresses understanding.

## 2018-10-12 ENCOUNTER — Encounter: Payer: Self-pay | Admitting: Emergency Medicine

## 2018-10-12 ENCOUNTER — Emergency Department (INDEPENDENT_AMBULATORY_CARE_PROVIDER_SITE_OTHER): Payer: Medicare HMO

## 2018-10-12 ENCOUNTER — Emergency Department
Admission: EM | Admit: 2018-10-12 | Discharge: 2018-10-12 | Disposition: A | Discharge status: Home / Self Care | Payer: Medicare HMO | Source: Home / Self Care

## 2018-10-12 DIAGNOSIS — S92352A Displaced fracture of fifth metatarsal bone, left foot, initial encounter for closed fracture: Secondary | ICD-10-CM | POA: Diagnosis not present

## 2018-10-12 DIAGNOSIS — W109XXA Fall (on) (from) unspecified stairs and steps, initial encounter: Secondary | ICD-10-CM | POA: Diagnosis not present

## 2018-10-12 MED ORDER — MELOXICAM 7.5 MG PO TABS: 7.5000 mg | ORAL_TABLET | Freq: Every day | ORAL | 0 refills | Status: DC

## 2018-10-12 NOTE — ED Provider Notes (Signed)
[facility]    CSN: [id number] Arrival date & time: 10/12/18  1638     History   Chief Complaint Chief Complaint  Patient presents with  . Ankle Pain    HPI Charlotte Simpson is a 53 y.o. female.   HPI  Charlotte Simpson is a 53 y.o. female presenting to UC with c/o Left ankle and foot pain with swelling and bruising that started about 5 hours ago after slipping and falling on steps while staying [place] the beach this weekend. Pt has been applying ice and elevating her leg during the 4 hour ride back from the beach but no relief. Pain is aching and throbbing, worse with movement, unable to bear weight. No other injuries from the fall.  Pt reports hx of "weak ankles" and believes her ankle gave way, causing her to fall. Hx of avulsion fracture of RIGHT ankle in 2018. No prior fracture or surgery on Left ankle/foot.    Per medical records, pt is on Suboxone due to hx of substance abuse.  Pt was prescribed oral naproxen and topical diclofenac for pain control for Right ankle fracture in 2018.  Past Medical History:  Diagnosis Date  . ADD (attention deficit disorder)   . Anxiety   . Diabetes mellitus without complication (HCC)   . Lymphedema   . Migraines   . Neuropathy 2012  . Pancreatitis Aug 2011  . Pulmonary embolism (HCC) 10/2010  . Sciatica     Patient Active Problem List   Diagnosis Date Noted  . Fracture, foot, right, closed, initial encounter 11/16/2016  . Polysubstance dependence in full sustained remission 08/13/2012    Class: Diagnosis of  . Generalized anxiety disorder 08/13/2012    Past Surgical History:  Procedure Laterality Date  . ABDOMINAL SURGERY    . BREAST SURGERY    . CHOLECYSTECTOMY    . KIDNEY STONE SURGERY    . PANCREATECTOMY    . PANCREATECTOMY     partial  . SPLENECTOMY, TOTAL      OB History   No obstetric history on file.      Home Medications    Prior to Admission medications   Medication Sig Start Date End Date Taking?  Authorizing Provider  sertraline (ZOLOFT) 50 MG tablet Take by mouth. 07/05/17  Yes [provider]  ziprasidone (GEODON) 20 MG capsule Take by mouth. 11/07/17  Yes [provider]  ALPRAZolam Prudy Feeler(XANAX) 1 MG tablet Take 1 mg by mouth Daily.  07/13/12   [provider]  amitriptyline (ELAVIL) 75 MG tablet Take 75 mg by mouth at bedtime.    [provider]  diclofenac sodium (VOLTAREN) 1 % GEL Apply 2 g topically 4 (four) times daily. 12/26/16   Rodolph Bongorey, Evan S, MD  meloxicam (MOBIC) 7.5 MG tablet Take 1-2 tablets (7.5-15 mg total) by mouth daily. 10/12/18   Lurene ShadowPhelps, Eligio Angert O, PA-C  metFORMIN (GLUCOPHAGE) 500 MG tablet Take by mouth 2 (two) times daily with a meal.    [provider]  rizatriptan (MAXALT) 10 MG tablet Take 10 mg by mouth Daily. 07/18/12   [provider]    Family History Family History  Problem Relation Age of Onset  . Alcohol abuse Father   . Drug abuse Father   . Dementia Maternal Grandfather   . Diabetes Mellitus II Paternal Grandfather     Social History Social History   Tobacco Use  . Smoking status: Never Smoker  . Smokeless tobacco: Never Used  Substance Use Topics  .  Alcohol use: No    Comment: Last drink Aug. 24, 2011. She was drinking 2-3 bottles of wine a day.  . Drug use: No    Types: Cocaine, Codeine    Comment: Last used Aug. 24, 2011     Allergies   Acetaminophen   Review of Systems Review of Systems  Musculoskeletal: Positive for arthralgias, gait problem, joint swelling and myalgias.  Skin: Positive for color change. Negative for wound.  Neurological: Positive for weakness. Negative for numbness.     Physical Exam Triage Vital Signs ED Triage Vitals  Enc Vitals Group     BP      Pulse      Resp      Temp      Temp src      SpO2      Weight      Height      Head Circumference      Peak Flow      Pain Score      Pain Loc      Pain Edu?      Excl. in GC?    No data  found.  Updated Vital Signs BP 127/84 (BP Location: Right Arm)   Pulse (!) 105   Temp 98.3 F (36.8 C) (Oral)   Ht 5' (1.524 m)   Wt 140 lb (63.5 kg)   SpO2 97%   BMI 27.34 kg/m   Visual Acuity Right Eye Distance:   Left Eye Distance:   Bilateral Distance:    Right Eye Near:   Left Eye Near:    Bilateral Near:     Physical Exam Vitals signs and nursing note reviewed.  Constitutional:      Appearance: She is well-developed.  HENT:     Head: Normocephalic and atraumatic.  Neck:     Musculoskeletal: Normal range of motion.  Cardiovascular:     Rate and Rhythm: Normal rate.     Pulses:          Dorsalis pedis pulses are 2+ on the left side.       Posterior tibial pulses are 2+ on the left side.  Pulmonary:     Effort: Pulmonary effort is normal.  Musculoskeletal:        General: Swelling, tenderness and signs of injury present.     Comments: Left ankle and foot: moderate edema, worse over anterior ankle and lateral distal foot. Tender. Limited movement of ankle due to pain. Calf is soft, non-tender.  Skin:    General: Skin is warm and dry.     Capillary Refill: Capillary refill takes less than 2 seconds.     Comments: Left foot: skin in tact. Ecchymosis over distal 5th metatarsal.   Neurological:     Mental Status: She is alert and oriented to person, place, and time.  Psychiatric:        Behavior: Behavior normal.      UC Treatments / Results  Labs (all labs ordered are listed, but only abnormal results are displayed) Labs Reviewed - No data to display  EKG None  Radiology No results found.  Procedures Procedures (including critical care time)  Medications Ordered in UC Medications - No data to display  Initial Impression / Assessment and Plan / UC Course  I have reviewed the triage vital signs and the nursing notes.  Pertinent labs & imaging results that were available during my care of the patient were reviewed by me and considered in my medical  decision making (see chart for details).     Closed fracture of Left fifth metatarsal. Discussed pt with Dr. Denyse Amassorey, Sports Medicine, who treated pt for her Right ankle fracture in 2018. Will place pt in cam walking boot Pt to f/u with Dr. Denyse Amassorey tomorrow, 10/13/2018 at 1PM for a 1:20PM appointment  Final Clinical Impressions(s) / UC Diagnoses   Final diagnoses:  Closed displaced fracture of fifth metatarsal bone of left foot, initial encounter     Discharge Instructions      Please follow up with Dr. Denyse Amassorey, Sports Medicine, tomorrow at 1PM for further evaluation and treatment of foot fracture. Avoid applying weight to your foot as this can worsen the fracture site. You may use crutches or a walker to help avoid applying weight on your foot. Please talk to Dr. Denyse Amassorey about your options for a knee walker if needed.   Meloxicam (Mobic) is an antiinflammatory to help with pain and inflammation.  Do not take ibuprofen, Advil, Aleve, or any other medications that contain NSAIDs while taking meloxicam as this may cause stomach upset or even ulcers if taken in large amounts for an extended period of time.  Taking this medication with your sertriline can increase your risk of stomach ulcers or even stomach bleeding.  Take this medication with food.          ED Prescriptions    Medication Sig Dispense Auth. Provider   meloxicam (MOBIC) 7.5 MG tablet Take 1-2 tablets (7.5-15 mg total) by mouth daily. 20 tablet Lurene ShadowPhelps, Raia Amico O, PA-C     Controlled Substance Prescriptions [place] Controlled Substance Registry consulted? Not Applicable   Rolla Platehelps, Shykeem Resurreccion O, PA-C 10/12/18 1754

## 2018-10-12 NOTE — ED Triage Notes (Signed)
Patient fell down some steps today, having left ankle and foot pain, some swelling and bruising.

## 2018-10-12 NOTE — Discharge Instructions (Addendum)
°  Please follow up with Dr. Denyse Amass, Sports Medicine, tomorrow at [facility] for further evaluation and treatment of foot fracture. Avoid applying weight to your foot as this can worsen the fracture site. You may use crutches or a walker to help avoid applying weight on your foot. Please talk to Dr. Denyse Amass about your options for a knee walker if needed.   Meloxicam (Mobic) is an antiinflammatory to help with pain and inflammation.  Do not take ibuprofen, Advil, Aleve, or any other medications that contain NSAIDs while taking meloxicam as this may cause stomach upset or even ulcers if taken in large amounts for an extended period of time.  Taking this medication with your sertriline can increase your risk of stomach ulcers or even stomach bleeding.  Take this medication with food.

## 2018-10-13 ENCOUNTER — Encounter: Payer: Self-pay | Admitting: Family Medicine

## 2018-10-13 ENCOUNTER — Ambulatory Visit (INDEPENDENT_AMBULATORY_CARE_PROVIDER_SITE_OTHER): Payer: Medicare HMO | Admitting: Family Medicine

## 2018-10-13 VITALS — BP 119/78 | HR 105

## 2018-10-13 DIAGNOSIS — S92355A Nondisplaced fracture of fifth metatarsal bone, left foot, initial encounter for closed fracture: Secondary | ICD-10-CM

## 2018-10-13 NOTE — Patient Instructions (Addendum)
Thank you for coming in today. Use the cam walker foot with activity as even at night.  Keep the foot still.  Recheck in 1 week or so.  You can take meloxicam up to 15mg  daily.    Metatarsal Fracture A metatarsal fracture is a break in one of the five bones that connect the toes to the rest of the foot. This may also be called a forefoot fracture. A metatarsal fracture may be:  A crack in the surface of the bone (stress fracture). This often occurs in athletes.  A break all the way through the bone (complete fracture). The bone that connects to the little toe (fifth metatarsal) is most commonly fractured. Ballet dancers often fracture this bone. What are the causes? A metatarsal fracture may be caused by:  Sudden twisting of the foot.  Falling onto the foot.  Something heavy falling onto the foot.  Overuse or repetitive exercise. What increases the risk? This condition is more likely to develop in people who:  Play contact sports.  Do ballet.  Have a condition that causes the bones to become thin and brittle (osteoporosis).  Have a low calcium level. What are the signs or symptoms? Symptoms of this condition include:  Pain that gets worse when walking or standing.  Pain when pressing on the foot or moving the toes.  Swelling.  Bruising on the top or bottom of the foot. How is this diagnosed? This condition may be diagnosed based on:  Your symptoms.  Any recent foot injuries you have had.  A physical exam.  An X-ray of your foot. If you have a stress fracture, it may not show up on an X-ray, and you may need other imaging tests, such as: ? A bone scan. ? CT scan. ? MRI. How is this treated? Treatment depends on how severe your fracture is and how the pieces of the broken bone line up with each other (alignment). Treatment may involve:  Wearing a cast, splint, or supportive boot on your foot.  Using crutches, and not putting any weight on your  foot.  Having surgery to align broken bones (open reduction and internal fixation, ORIF).  Physical therapy.  Follow-up visits and X-rays to make sure you are healing. Follow these instructions at home: If you have a splint or a supportive boot:  Wear the splint or boot as told by your health care provider. Remove it only as told by your health care provider.  Loosen the splint or boot if your toes tingle, become numb, or turn cold and blue.  Keep the splint or boot clean.  If your splint or boot is not waterproof: ? Do not let it get wet. ? Cover it with a watertight covering when you take a bath or a shower. If you have a cast:  Do not stick anything inside the cast to scratch your skin. Doing that increases your risk for infection.  Check the skin around the cast every day. Tell your health care provider about any concerns.  You may put lotion on dry skin around the edges of the cast. Do not put lotion on the skin underneath the cast.  Keep the cast clean.  If the cast is not waterproof: ? Do not let it get wet. ? Cover it with a watertight covering when you take a bath or a shower. Activity  Do not use your affected leg to support your body weight until your health care provider says that you can. Use  crutches as directed.  Ask your health care provider what activities are safe for you during recovery, and ask what activities you need to avoid.  Do physical therapy exercises as directed. Driving  Do not drive or use heavy machinery while taking pain medicine.  Do not drive while wearing a cast, splint, or boot on a foot that you use for driving. Managing pain, stiffness, and swelling   If directed, put ice on painful areas: ? Put ice in a plastic bag. ? Place a towel between your skin and the bag.  If you have a removable splint or boot, remove it as told by your health care provider.  If you have a cast, place a towel between your cast and the bag. ? Leave  the ice on for 20 minutes, 2-3 times a day.  Move your toes often to avoid stiffness and to lessen swelling.  Raise (elevate) your lower leg above the level of your heart while you are sitting or lying down. General instructions  Do not put pressure on any part of the cast or splint until it is fully hardened. This may take several hours.  Take over-the-counter and prescription medicines only as told by your health care provider.  Do not use any products that contain nicotine or tobacco, such as cigarettes and e-cigarettes. These can delay bone healing. If you need help quitting, ask your health care provider.  Do not take baths, swim, or use a hot tub until your health care provider approves. Ask your health care provider if you may take showers.  Keep all follow-up visits as told by your health care provider. This is important. Contact a health care provider if you have:  Pain that gets worse or does not get better with medicine.  A fever.  A bad smell coming from your cast or splint. Get help right away if you have:  Any of the following in your toes or your foot, even after loosening your splint (if applicable): ? Numbness. ? Tingling. ? Coldness. ? Blue skin.  Redness or swelling that gets worse.  Pain that suddenly becomes severe. Summary  A metatarsal fracture is a break in one of the five bones that connect the toes to the rest of the foot.  Treatment depends on how severe your fracture is and how the pieces of the broken bone line up with each other (alignment). This may include wearing a cast, splint, or supportive boot, or using crutches. Sometimes surgery is needed to align the bones.  Ice and elevate your foot to help lessen the pain and swelling.  Make sure you know what symptoms should cause you to get help right away. This information is not intended to replace advice given to you by your health care provider. Make sure you discuss any questions you have  with your health care provider. Document Released: 06/09/2002 Document Revised: 10/14/2017 Document Reviewed: 10/14/2017 Elsevier Interactive Patient Education  2019 [employer]

## 2018-10-13 NOTE — Progress Notes (Signed)
Charlotte Simpson is a 53 y.o. female who presents to [facility] today for left toe fracture.  Charlotte Simpson was seen in urgent care yesterday for fracture of her left distal fifth metatarsal.  This occurred after she [place] the beach.  She notes pain and swelling.  In urgent care she was found to have a distal metatarsal fracture that was slightly displaced.  She was treated with postop shoe and limited weightbearing.  She notes that she continues to experience significant pain and swelling of the foot but also has significant pain and swelling into the lateral ankle.  She thinks she may have sprained her ankle this part of the original injury.  She has been taking meloxicam 7.5 mg daily which is helped a bit for some pain.  However she does continue to experience significant pain.  She is prescribed Buprenorphine chronically.    ROS:  As above  Exam:  BP 119/78   Pulse (!) 105  Wt Readings from Last 5 Encounters:  10/12/18 140 lb (63.5 kg)  12/26/16 164 lb (74.4 kg)  12/12/16 166 lb (75.3 kg)  11/16/16 160 lb (72.6 kg)  11/12/16 148 lb (67.1 kg)   General: Well Developed, well nourished, and in no acute distress.  Neuro/Psych: Alert and oriented x3, extra-ocular muscles intact, able to move all 4 extremities, sensation grossly intact. Skin: Warm and dry, no rashes noted.  Respiratory: Not using accessory muscles, speaking in full sentences, trachea midline.  Cardiovascular: Pulses palpable, no extremity edema. Abdomen: Does not appear distended. MSK: Left knee: Normal-appearing nontender normal motion. Left ankle swelling mostly laterally without ecchymosis. Ankle motion limited by pain.  Tender palpation ATFL area. Left foot swollen especially distally.  Tender palpation fifth metatarsal.  Sensation is intact distally.  Capillary refill and pulses are intact.    Lab and Radiology Results No results found for this or any  previous visit (from the past 72 hour(s)). Dg Ankle Complete Left  Result Date: 10/12/2018 CLINICAL DATA:  Larey Seat down stairs this morning. Lateral ankle pain and bruising. Initial encounter. EXAM: LEFT ANKLE COMPLETE - 3+ VIEW COMPARISON:  None. FINDINGS: There is no evidence of fracture, dislocation, or joint effusion. There is no evidence of arthropathy or other focal bone abnormality. Marked lateral and anterior soft tissue swelling is noted. IMPRESSION: Marked soft tissue swelling. No evidence of fracture or dislocation. Electronically Signed   By: Myles Rosenthal M.D.   On: 10/12/2018 18:00   Dg Foot Complete Left  Result Date: 10/12/2018 CLINICAL DATA:  Larey Seat down stairs this morning. Lateral foot pain and bruising. Initial encounter. EXAM: LEFT FOOT - COMPLETE 3+ VIEW COMPARISON:  None. FINDINGS: A mildly comminuted and impacted fracture of the distal 5th metatarsal is seen. No significant angulation. No other fractures identified. No evidence of dislocation. Moderate hallux valgus with bony and soft tissue bunion noted. IMPRESSION: 1. Distal 5th metatarsal fracture as described above. 2. Moderate hallux valgus with bunion. Electronically Signed   By: Myles Rosenthal M.D.   On: 10/12/2018 18:02  I personally (independently) visualized and performed the interpretation of the images attached in this note.      Assessment and Plan: 53 y.o. female with  Left distal fifth metatarsal fracture.  Slightly displaced but anatomical alignment is relatively preserved.  We had discussion.  This fracture could probably benefit from attempted closed reduction.  As part of the discussion I discussed how we would plan to anesthetize it using  metatarsal block and possibly hematoma block and that I am not confident that I would be able to adequately reduce it with that it would stay reduced.  I am moderately optimistic about this reduction attempt.  After discussion she elected for trial of watchful waiting with  immobilization.  Plan to transition to cam walker boot and recheck in 1 week.  Okay to increase meloxicam to 15 mg total per day and to recheck in 1 week.   PDMP reviewed during this encounter. No orders of the defined types were placed in this encounter.  No orders of the defined types were placed in this encounter.   Historical information moved to improve visibility of documentation.  Past Medical History:  Diagnosis Date  . ADD (attention deficit disorder)   . Anxiety   . Diabetes mellitus without complication (HCC)   . Lymphedema   . Migraines   . Neuropathy 2012  . Pancreatitis Aug 2011  . Pulmonary embolism (HCC) 10/2010  . Sciatica    Past Surgical History:  Procedure Laterality Date  . ABDOMINAL SURGERY    . BREAST SURGERY    . CHOLECYSTECTOMY    . KIDNEY STONE SURGERY    . PANCREATECTOMY    . PANCREATECTOMY     partial  . SPLENECTOMY, TOTAL     Social History   Tobacco Use  . Smoking status: Never Smoker  . Smokeless tobacco: Never Used  Substance Use Topics  . Alcohol use: No    Comment: Last drink Aug. 24, 2011. She was drinking 2-3 bottles of wine a day.   family history includes Alcohol abuse in her father; Dementia in her maternal grandfather; Diabetes Mellitus II in her paternal grandfather; Drug abuse in her father.  Medications: Current Outpatient Medications  Medication Sig Dispense Refill  . ALPRAZolam (XANAX) 1 MG tablet Take 1 mg by mouth Daily.     Marland Kitchen amitriptyline (ELAVIL) 75 MG tablet Take 75 mg by mouth at bedtime.    . diclofenac sodium (VOLTAREN) 1 % GEL Apply 2 g topically 4 (four) times daily. 100 g 12  . meloxicam (MOBIC) 7.5 MG tablet Take 1-2 tablets (7.5-15 mg total) by mouth daily. 20 tablet 0  . metFORMIN (GLUCOPHAGE) 500 MG tablet Take by mouth 2 (two) times daily with a meal.    . rizatriptan (MAXALT) 10 MG tablet Take 10 mg by mouth Daily.    . sertraline (ZOLOFT) 50 MG tablet Take by mouth.    . ziprasidone (GEODON) 20 MG  capsule Take by mouth.     No current facility-administered medications for this visit.    Allergies  Allergen Reactions  . Acetaminophen       Discussed warning signs or symptoms. Please see discharge instructions. Patient expresses understanding.

## 2018-10-17 ENCOUNTER — Telehealth: Payer: Self-pay

## 2018-10-17 NOTE — Telephone Encounter (Signed)
Charlotte Simpson has a lot of pain with her left foot injury. She is wanting something that will relieve the pain long enough for her to sleep. She would like 3-5 tablets of Hydrocodone.    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 She has been off the suboxone since Thursday of last week.

## 2018-10-17 NOTE — Telephone Encounter (Signed)
Looking through her chart I would recommend that she do arthritis strength Tylenol 650 mg 3 times per day, this is over-the-counter and highly potent, and she can do this with her meloxicam.  Acetaminophen is listed as an allergy but if she is requesting hydrocodone, this commonly only comes with acetaminophen indicating that acetaminophen is not a true allergy.  In fact she just got some on June 23, 2018, so I am going to remove acetaminophen from her allergy list.  Lastly Suboxone is a partial agonist at the opiate receptor, so it will block the effects of hydrocodone, they should not be used together.

## 2018-10-20 ENCOUNTER — Ambulatory Visit: Payer: Self-pay | Admitting: Family Medicine

## 2018-10-20 NOTE — Telephone Encounter (Signed)
Ok will follow up on the 24th

## 2018-10-20 NOTE — Telephone Encounter (Signed)
FYI: Spoke to patient gave her advise as noted below. Patient stated that she did not request Hydrocodone and insisted that she was in a lot of pain, she stated that she has bruising and her ankle is hurting and needed to be seen. Advised patient that Dr. Denyse Amassorey was out of the office and the other sports doctor did not have any openings advised patient that if she felt like she needed to be seen today then she could go to Urgent care for further evaluation. Patient stated that she would take Dr. Denyse Amassorey next available appointment which is Friday 10/24/2018.. she was addiment about providers treating her different because of her past pain medication addiction. She stated that is what makes people go out on the streets and try to get drugs. She did refuse the recommended medication that was advised by Dr. Jarvis Newcomer. Rhonda Cunningham,CMA

## 2018-10-24 ENCOUNTER — Ambulatory Visit: Payer: Self-pay | Admitting: Family Medicine

## 2018-10-24 ENCOUNTER — Ambulatory Visit (INDEPENDENT_AMBULATORY_CARE_PROVIDER_SITE_OTHER): Payer: Medicare HMO | Admitting: Family Medicine

## 2018-10-24 ENCOUNTER — Encounter: Payer: Self-pay | Admitting: Family Medicine

## 2018-10-24 ENCOUNTER — Telehealth: Payer: Self-pay | Admitting: Family Medicine

## 2018-10-24 ENCOUNTER — Ambulatory Visit (INDEPENDENT_AMBULATORY_CARE_PROVIDER_SITE_OTHER): Payer: Medicare HMO

## 2018-10-24 VITALS — BP 126/81 | HR 111 | Temp 98.4°F

## 2018-10-24 DIAGNOSIS — S92355A Nondisplaced fracture of fifth metatarsal bone, left foot, initial encounter for closed fracture: Secondary | ICD-10-CM

## 2018-10-24 DIAGNOSIS — S92355D Nondisplaced fracture of fifth metatarsal bone, left foot, subsequent encounter for fracture with routine healing: Secondary | ICD-10-CM

## 2018-10-24 DIAGNOSIS — S8261XA Displaced fracture of lateral malleolus of right fibula, initial encounter for closed fracture: Secondary | ICD-10-CM

## 2018-10-24 DIAGNOSIS — IMO0001 Reserved for inherently not codable concepts without codable children: Secondary | ICD-10-CM

## 2018-10-24 DIAGNOSIS — S8261XD Displaced fracture of lateral malleolus of right fibula, subsequent encounter for closed fracture with routine healing: Secondary | ICD-10-CM

## 2018-10-24 DIAGNOSIS — S93402D Sprain of unspecified ligament of left ankle, subsequent encounter: Secondary | ICD-10-CM

## 2018-10-24 DIAGNOSIS — W109XXA Fall (on) (from) unspecified stairs and steps, initial encounter: Secondary | ICD-10-CM

## 2018-10-24 MED ORDER — CELECOXIB 200 MG PO CAPS: ORAL_CAPSULE | ORAL | 2 refills | Status: AC

## 2018-10-24 NOTE — Patient Instructions (Addendum)
Thank you for coming in today. Continue the non weight  Bearing.  Recheck in about 2 weeks.  If you get better ok to remove the splint and switch to the [employer].  Ok to return the cam walker. Look for the pink don joy sheet.   I will ask Dr Susa Simmonds about your foot. He may say come in.    Cast or Splint Care, Adult Casts and splints are supports that are worn to protect broken bones and other injuries. A cast or splint may hold a bone still and in the correct position while it heals. Casts and splints may also help to ease pain, swelling, and muscle spasms. How to care for your cast   Do not stick anything inside the cast to scratch your skin.  Check the skin around the cast every day. Tell your doctor about any concerns.  You may put lotion on dry skin around the edges of the cast. Do not put lotion on the skin under the cast.  Keep the cast clean.  If the cast is not waterproof: ? Do not let it get wet. ? Cover it with a watertight covering when you take a bath or a shower. How to care for your splint   Wear it as told by your doctor. Take it off only as told by your doctor.  Loosen the splint if your fingers or toes tingle, get numb, or turn cold and blue.  Keep the splint clean.  If the splint is not waterproof: ? Do not let it get wet. ? Cover it with a watertight covering when you take a bath or a shower. Follow these instructions at home: Bathing  Do not take baths or swim until your doctor says it is okay. Ask your doctor if you can take showers. You may only be allowed to take sponge baths for bathing.  If your cast or splint is not waterproof, cover it with a watertight covering when you take a bath or shower. Managing pain, stiffness, and swelling  Move your fingers or toes often to avoid stiffness and to lessen swelling.  Raise (elevate) the injured area above the level of your heart while sitting or lying down. Safety  Do not use the injured limb to  support your body weight until your doctor says that it is okay.  Use crutches or other assistive devices as told by your doctor. General instructions  Do not put pressure on any part of the cast or splint until it is fully hardened. This may take many hours.  Return to your normal activities as told by your doctor. Ask your doctor what activities are safe for you.  Keep all follow-up visits as told by your doctor. This is important. Contact a doctor if:  Your cast or splint gets damaged.  The skin around the cast gets red or raw.  The skin under the cast is very itchy or painful.  Your cast or splint feels very uncomfortable.  Your cast or splint is too tight or too loose.  Your cast becomes wet or it starts to have a soft spot or area.  You get an object stuck under your cast. Get help right away if:  Your pain gets worse.  The injured area tingles, gets numb, or turns blue and cold.  The part of your body above or below the cast is swollen and it turns a different color (is discolored).  You cannot feel or move your fingers or toes.  There is fluid leaking through the cast.  You have very bad pain or pressure under the cast.  You have trouble breathing.  You have shortness of breath.  You have chest pain. This information is not intended to replace advice given to you by your health care provider. Make sure you discuss any questions you have with your health care provider. Document Released: 01/17/2011 Document Revised: 09/07/2016 Document Reviewed: 09/07/2016 Elsevier Interactive Patient Education  2019 [employer].

## 2018-10-24 NOTE — Telephone Encounter (Signed)
I spoke with the foot surgeon.  He agrees with our plan. He states it certainly could have surgery but if you want to avoid surgery it is okay to treat it like we are doing.

## 2018-10-24 NOTE — Progress Notes (Signed)
Charlotte Simpson is a 53 y.o. female who presents [facility] today for left distal fifth metatarsal fracture and ankle sprain. Charlotte Simpson fell down stairs well at a beach house on or around January 12.  She thinks she may have inverted her foot and ankle but is not sure.  She presented to urgent care on January 12 where she was diagnosed with a distal fifth metatarsal fracture and an ankle sprain.  She was treated with a Cam walker boot on follow-up with me on the 13th. She notes in the interim she is continued to have quite a bit of pain especially at the lateral malleolus of her ankle.  She does have some pain and swelling in her forefoot as well.  She notes she has trouble with weightbearing due to ankle pain.  She denies any radiating pain weakness or numbness fevers or chills.  She notes meloxicam has not been sufficient to control her pain.  She has been taking Tylenol at times and ibuprofen at times which do help.  She notes he is having a little bit of stomach irritation with ibuprofen and meloxicam.  She denies any current vomiting diarrhea or blood in the stool or vomit.    ROS:  As above  Exam:  BP 126/81 (BP Location: Left Arm, Patient Position: Sitting, Cuff Size: Normal)   Pulse (!) 111   Temp 98.4 F (36.9 C) (Oral)  Wt Readings from Last 5 Encounters:  10/12/18 140 lb (63.5 kg)  12/26/16 164 lb (74.4 kg)  12/12/16 166 lb (75.3 kg)  11/16/16 160 lb (72.6 kg)  11/12/16 148 lb (67.1 kg)   General: Well Developed, well nourished, and in no acute distress.  Neuro/Psych: Alert and oriented x3, extra-ocular muscles intact, able to move all 4 extremities, sensation grossly intact. Skin: Warm and dry, no rashes noted.  Respiratory: Not using accessory muscles, speaking in full sentences, trachea midline.  Cardiovascular: Pulses palpable, no extremity edema. Abdomen: Does not appear distended. MSK:  Left ankle swollen at lateral malleolus  ATFL area. Ankle motion is intact. Tender to palpation at ATFL insertion. Patient guards with stability testing.  Left foot: Slightly swollen with some bruising at the forefoot.  Tender palpation distal fifth metatarsal.  No gross anatomical misalignment.  Contralateral right foot and ankle normal-appearing nontender normal motion normal strength.  Pulses capillary fill and sensation are intact distally.    Lab and Radiology Results X-ray images left foot and ankle personally independently reviewed.  Left ankle: Tiny stable rounded avulsion fragment present at medial malleolus.  No acute fractures or malalignment.  No significant change from prior studies.  Left foot x-ray shows continued comminuted angulated to slightly displaced fracture at distal fifth metatarsal.  Slightly foreshortened by about 4 mm compared to x-ray images on January 12.  Await formal radiology review   Well fitted posterior leg splint applied today.  Assessment and Plan: 53 y.o. female with  Left foot fracture: Fracture has not changed in position to a great deal but has foreshortened slightly.  The fracture certainly is not healing however the overall positioning appears to be reasonable and functional.  Patient notes that she would like to avoid surgery if possible and that she does not have a lot of pain in the forefoot around the fracture.  Plan to treat with posterior leg splint as noted below and recheck in 1 to 2 weeks.  Additionally plan to discuss with orthopedic surgery.  Left ankle sprain:  This appears to be the source of the majority of Charlotte Simpson's pain.  She is having discomfort with the cam walker boot and if she is not weightbearing I think a good neck step would be posterior leg splint as it is lighter and more stabilizing.  Plan for posterior leg splint nonweightbearing and recheck in 1 to 2 weeks.  Discontinue meloxicam and ibuprofen.  Continue Tylenol as needed.  Will add Celebrex as well   PDMP  reviewed during this encounter.  Historical information moved to improve visibility of documentation.  Past Medical History:  Diagnosis Date  . ADD (attention deficit disorder)   . Anxiety   . Diabetes mellitus without complication (HCC)   . Lymphedema   . Migraines   . Neuropathy 2012  . Pancreatitis Aug 2011  . Pulmonary embolism (HCC) 10/2010  . Sciatica    Past Surgical History:  Procedure Laterality Date  . ABDOMINAL SURGERY    . BREAST SURGERY    . CHOLECYSTECTOMY    . KIDNEY STONE SURGERY    . PANCREATECTOMY    . PANCREATECTOMY     partial  . SPLENECTOMY, TOTAL     Social History   Tobacco Use  . Smoking status: Never Smoker  . Smokeless tobacco: Never Used  Substance Use Topics  . Alcohol use: No    Comment: Last drink Aug. 24, 2011. She was drinking 2-3 bottles of wine a day.   family history includes Alcohol abuse in her father; Dementia in her maternal grandfather; Diabetes Mellitus II in her paternal grandfather; Drug abuse in her father.  Medications: Current Outpatient Medications  Medication Sig Dispense Refill  . ALPRAZolam (XANAX) 1 MG tablet Take 1 mg by mouth Daily.     Marland Kitchen. amitriptyline (ELAVIL) 75 MG tablet Take 75 mg by mouth at bedtime.    . diclofenac sodium (VOLTAREN) 1 % GEL Apply 2 g topically 4 (four) times daily. 100 g 12  . meloxicam (MOBIC) 7.5 MG tablet Take 1-2 tablets (7.5-15 mg total) by mouth daily. 20 tablet 0  . metFORMIN (GLUCOPHAGE) 500 MG tablet Take by mouth 2 (two) times daily with a meal.    . rizatriptan (MAXALT) 10 MG tablet Take 10 mg by mouth Daily.    . sertraline (ZOLOFT) 50 MG tablet Take by mouth.    . ziprasidone (GEODON) 20 MG capsule Take by mouth.    . celecoxib (CELEBREX) 200 MG capsule One to 2 tablets by mouth daily as needed for pain. 60 capsule 2   No current facility-administered medications for this visit.    Allergies  Allergen Reactions  . Acetaminophen Other (See Comments) and Swelling     Precaution... To much for liver   . Doripenem Hives      Discussed warning signs or symptoms. Please see discharge instructions. Patient expresses understanding.

## 2018-10-24 NOTE — Progress Notes (Signed)
Visit rescheduled 

## 2018-10-24 NOTE — Telephone Encounter (Signed)
Pt advised.

## 2018-11-06 ENCOUNTER — Ambulatory Visit (INDEPENDENT_AMBULATORY_CARE_PROVIDER_SITE_OTHER): Payer: Medicare HMO

## 2018-11-06 ENCOUNTER — Ambulatory Visit (INDEPENDENT_AMBULATORY_CARE_PROVIDER_SITE_OTHER): Payer: Medicare HMO | Admitting: Family Medicine

## 2018-11-06 VITALS — BP 106/63 | HR 98 | Ht 60.0 in | Wt 166.0 lb

## 2018-11-06 DIAGNOSIS — S92355A Nondisplaced fracture of fifth metatarsal bone, left foot, initial encounter for closed fracture: Secondary | ICD-10-CM

## 2018-11-06 DIAGNOSIS — S61213A Laceration without foreign body of left middle finger without damage to nail, initial encounter: Secondary | ICD-10-CM

## 2018-11-06 DIAGNOSIS — S93402D Sprain of unspecified ligament of left ankle, subsequent encounter: Secondary | ICD-10-CM

## 2018-11-06 DIAGNOSIS — X58XXXD Exposure to other specified factors, subsequent encounter: Secondary | ICD-10-CM

## 2018-11-06 DIAGNOSIS — S92355D Nondisplaced fracture of fifth metatarsal bone, left foot, subsequent encounter for fracture with routine healing: Secondary | ICD-10-CM | POA: Diagnosis not present

## 2018-11-06 DIAGNOSIS — IMO0001 Reserved for inherently not codable concepts without codable children: Secondary | ICD-10-CM

## 2018-11-06 NOTE — Patient Instructions (Addendum)
Thank you for coming in today. Use the aircast  Use the post op shoe.   Use finger cots if needed.  Keep the wound clean and covered with ointment.   Recheck in 3 weeks if all is well.   Get a Steel Turf Toe insole.  Do a Microbiologist for [employer]

## 2018-11-06 NOTE — Progress Notes (Signed)
Dana AllanGina Goldenstein is a 53 y.o. female who presents to [facility] today for left foot fracture.  Amairany fell down stairs and suffered an injury to her left foot and 12.  She was seen in urgent care where she was found to have a distal fifth metatarsal fracture and ankle sprain.  She was treated with Cam walker boot but notes that she had quite a bit of pain with the boot.  She thinks it was heavy and hurting her foot.  On follow-up on January 24 she was switched to a posterior leg splint for comfort and to take pressure off the dorsal part of her foot and for weight.  She notes this is been much better and her pain is much better controlled.  She is happy with how things are going.  During the initial injury she also suffered an ankle sprain.  She notes that the lateral ankle pain is been worse than the foot pain.  She felt much better with the posterior leg splint but would like to participate in more walking if possible.   Additionally she notes that she suffered a laceration to her left third digit of her left hand yesterday at 5pm.  She cleaned the wound and applied a dressing.  She notes that is no longer bleeding.  She feels well.  Her last tetanus vaccine was a few years ago.   ROS:  As above  Exam:  BP 106/63   Pulse 98   Ht 5' (1.524 m)   Wt 166 lb (75.3 kg)   BMI 32.42 kg/m  Wt Readings from Last 5 Encounters:  11/06/18 166 lb (75.3 kg)  10/12/18 140 lb (63.5 kg)  12/26/16 164 lb (74.4 kg)  12/12/16 166 lb (75.3 kg)  11/16/16 160 lb (72.6 kg)   General: Well Developed, well nourished, and in no acute distress.  Neuro/Psych: Alert and oriented x3, extra-ocular muscles intact, able to move all 4 extremities, sensation grossly intact. Skin: Warm and dry, no rashes noted.  Respiratory: Not using accessory muscles, speaking in full sentences, trachea midline.  Cardiovascular: Pulses palpable, no extremity edema. Abdomen: Does not appear  distended. MSK:  Left ankle: Relatively normal-appearing.  Not very swollen.  No ecchymosis erythema. Normal ankle motion. Mildly tender to palpation ATFL area. Able to ligaments exam testing however guarding with exam testing. Left foot: Slightly swollen distal forefoot.  Not particularly tender.  Normal foot motion.  Pulses capillary fill and sensation are intact distally.  Left third digit crescent-shaped laceration at the radial side of the digit at the distal phalanx.  Skin edges are approximated no bleeding or erythema.  Normal finger motion.  Lab and Radiology Results X-ray images left foot personally independently reviewed Stable appearance of comminuted fracture distal fifth metatarsal.  Some early callus formation present. Await formal radiology review     Assessment and Plan: 53 y.o. female with  Left ankle sprain and distal fifth metatarsal fracture.  Clinically improving but still quite symptomatic.  Patient not tolerant of cam walker boot.  We will switch to Aircast with postop shoe.  Recheck 3 weeks.  Finger laceration doing well.  Conservative management.  Tdap up-to-date.   PDMP not reviewed this encounter. Orders Placed This Encounter  Procedures  . DG Foot Complete Left    Standing Status:   Future    Standing Expiration Date:   01/05/2020    Order Specific Question:   Reason for Exam (SYMPTOM  OR DIAGNOSIS  REQUIRED)    Answer:   follow up fx    Order Specific Question:   Is patient pregnant?    Answer:   No    Order Specific Question:   Preferred imaging location?    Answer:   [facility]    Order Specific Question:   Radiology Contrast Protocol - do NOT remove file path    Answer:   \\[email or web address]   No orders of the defined types were placed in this encounter.   Historical information moved to improve visibility of documentation.  Past Medical History:  Diagnosis Date  . ADD (attention deficit  disorder)   . Anxiety   . Diabetes mellitus without complication (HCC)   . Lymphedema   . Migraines   . Neuropathy 2012  . Pancreatitis Aug 2011  . Pulmonary embolism (HCC) 10/2010  . Sciatica    Past Surgical History:  Procedure Laterality Date  . ABDOMINAL SURGERY    . BREAST SURGERY    . CHOLECYSTECTOMY    . KIDNEY STONE SURGERY    . PANCREATECTOMY    . PANCREATECTOMY     partial  . SPLENECTOMY, TOTAL     Social History   Tobacco Use  . Smoking status: Never Smoker  . Smokeless tobacco: Never Used  Substance Use Topics  . Alcohol use: No    Comment: Last drink Aug. 24, 2011. She was drinking 2-3 bottles of wine a day.   family history includes Alcohol abuse in her father; Dementia in her maternal grandfather; Diabetes Mellitus II in her paternal grandfather; Drug abuse in her father.  Medications: Current Outpatient Medications  Medication Sig Dispense Refill  . ALPRAZolam (XANAX) 1 MG tablet Take 1 mg by mouth Daily.     Marland Kitchen amitriptyline (ELAVIL) 75 MG tablet Take 75 mg by mouth at bedtime.    . celecoxib (CELEBREX) 200 MG capsule One to 2 tablets by mouth daily as needed for pain. 60 capsule 2  . diclofenac sodium (VOLTAREN) 1 % GEL Apply 2 g topically 4 (four) times daily. 100 g 12  . metFORMIN (GLUCOPHAGE) 500 MG tablet Take by mouth 2 (two) times daily with a meal.    . rizatriptan (MAXALT) 10 MG tablet Take 10 mg by mouth Daily.    . sertraline (ZOLOFT) 50 MG tablet Take by mouth.    . ziprasidone (GEODON) 20 MG capsule Take by mouth.     No current facility-administered medications for this visit.    Allergies  Allergen Reactions  . Acetaminophen Other (See Comments) and Swelling    Precaution... To much for liver   . Doripenem Hives      Discussed warning signs or symptoms. Please see discharge instructions. Patient expresses understanding.

## 2018-11-07 IMAGING — DX DG FOOT COMPLETE 3+V*R*
3 series · 3 of 3 positions shown · non-contrast
Comparison: 09/14/2015

CLINICAL DATA: Fell [REDACTED], persistent pain and swelling at
RIGHT ankle and RIGHT foot, history diabetes mellitus

EXAM:
RIGHT FOOT COMPLETE - 3+ VIEW

[foot lat]
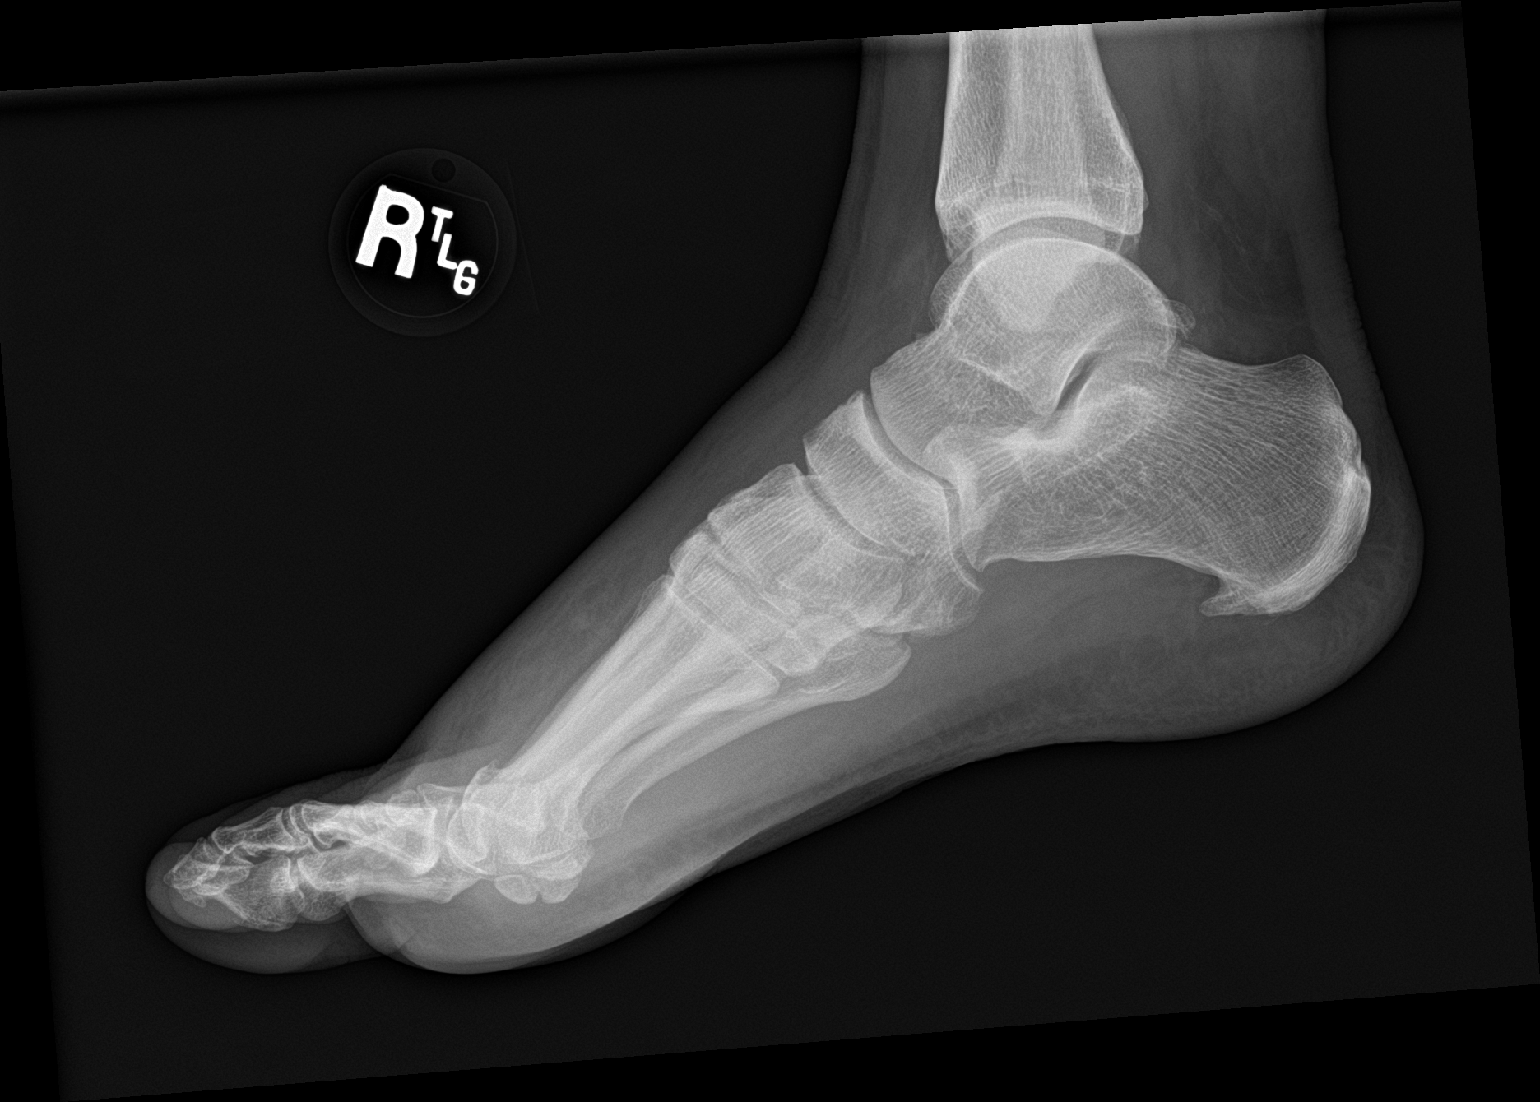

[foot obl]
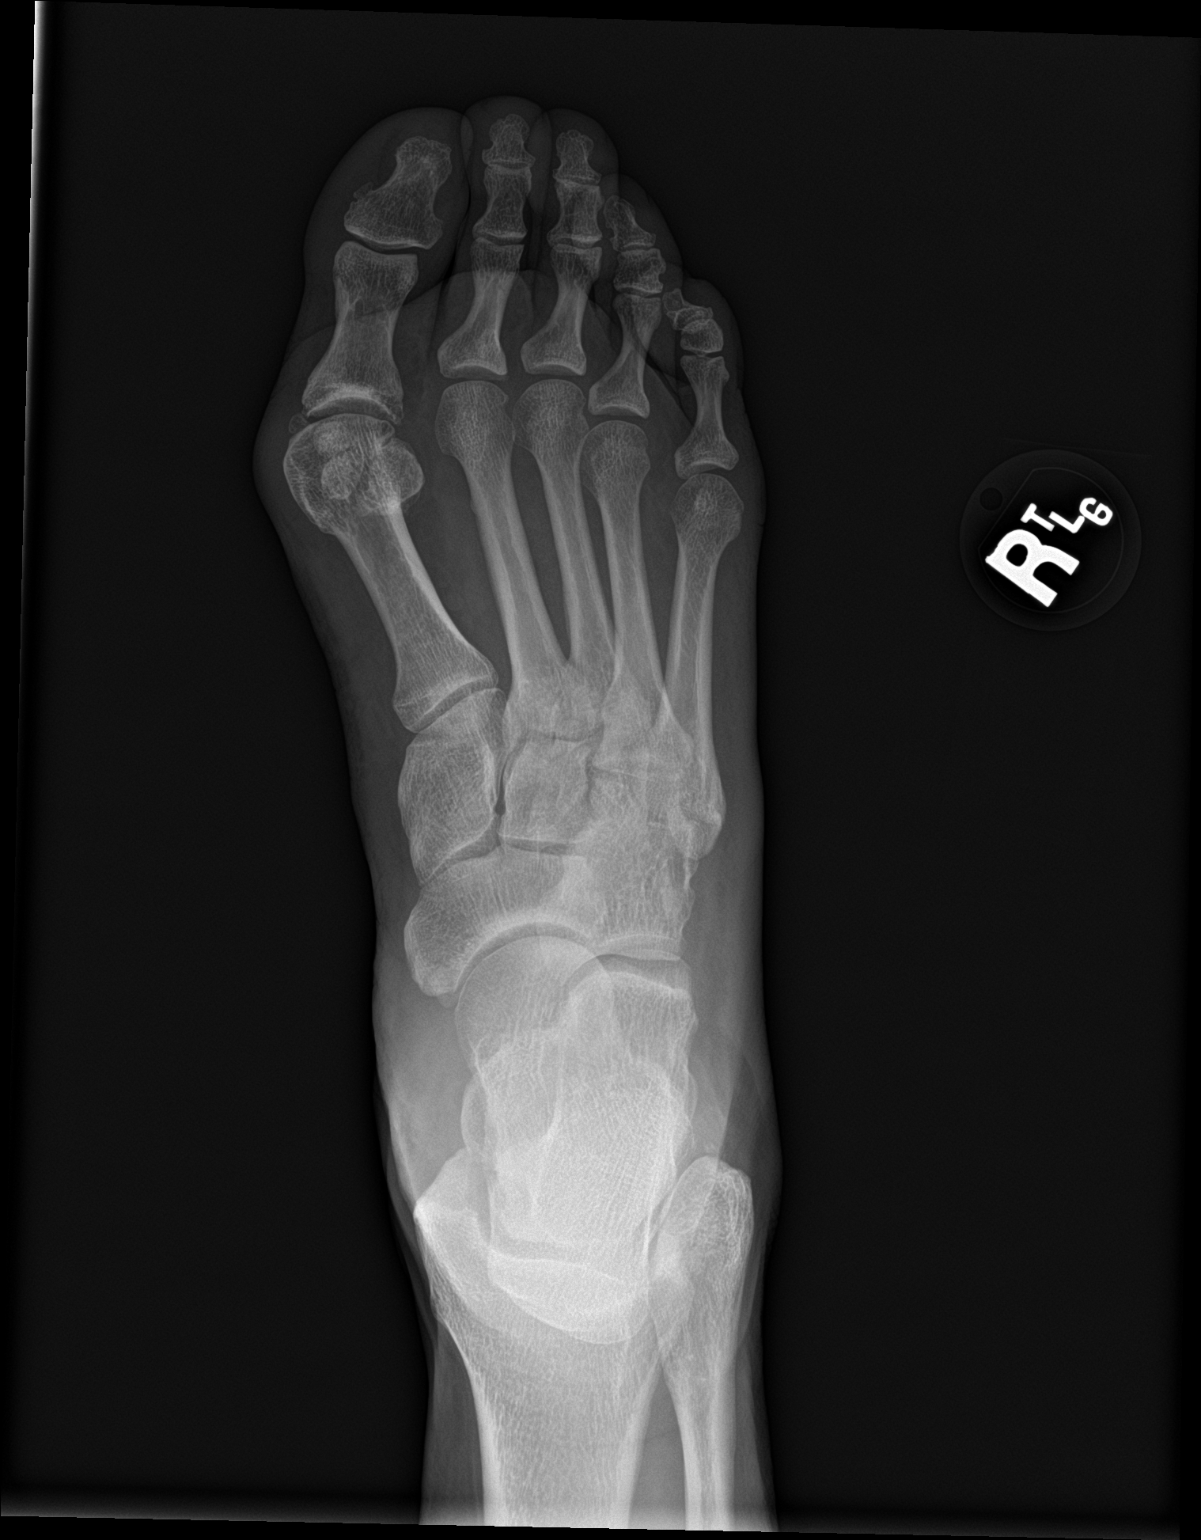

[foot ap]
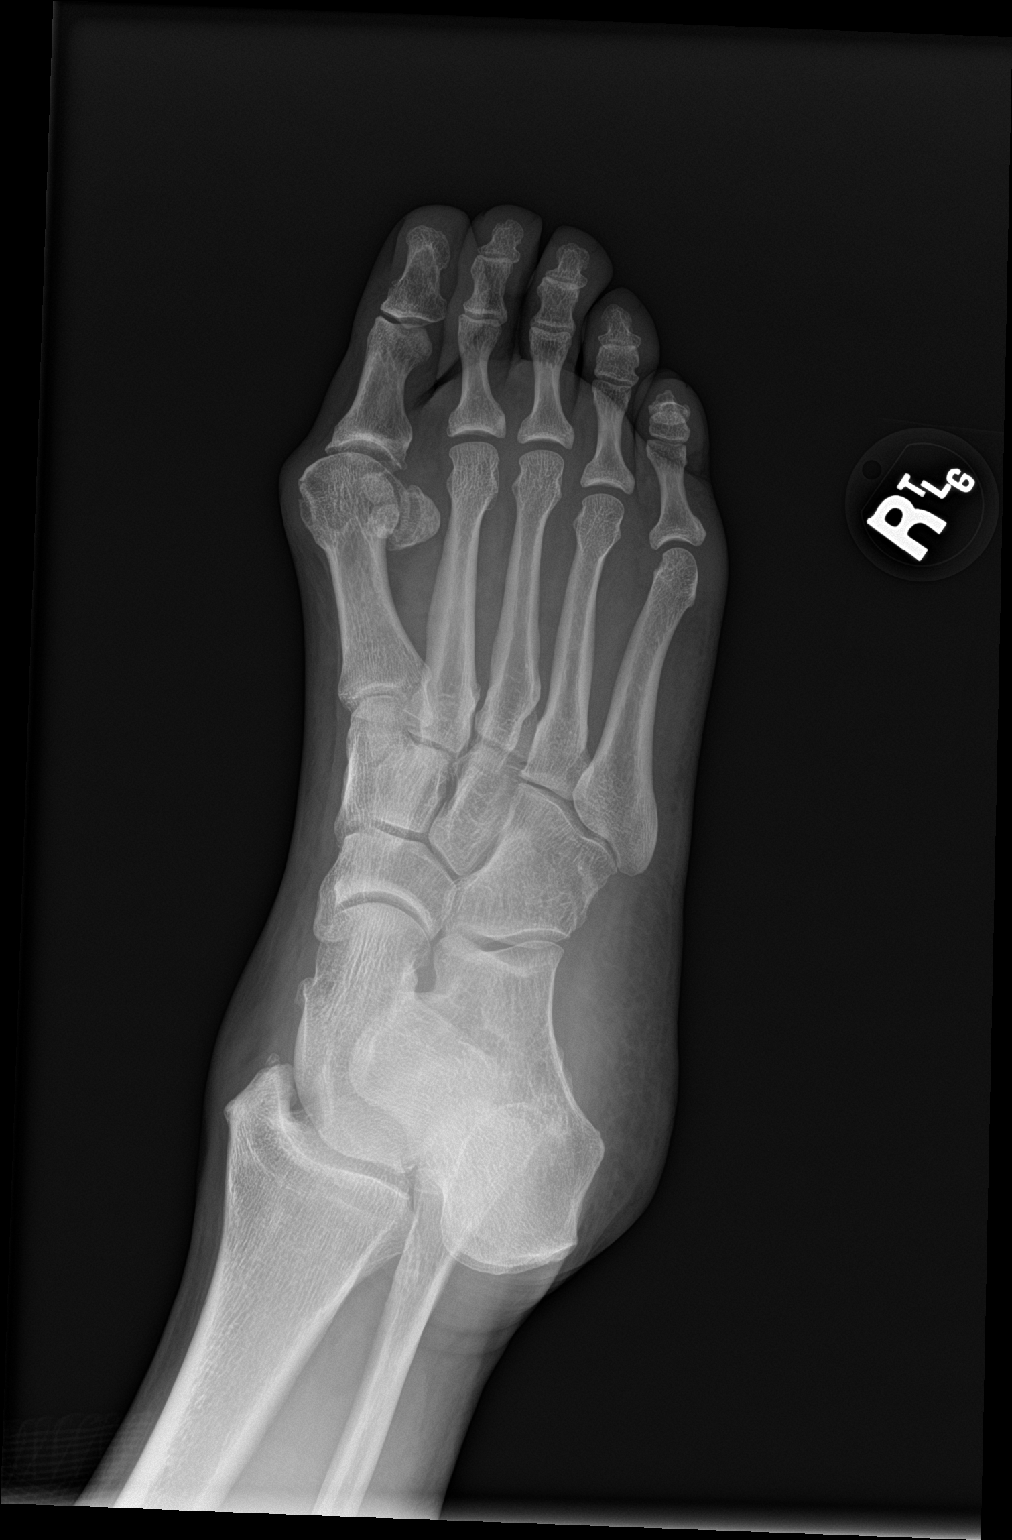

[3 of 3 positions shown; findings below may reference images not displayed]

FINDINGS: Mild osseous demineralization.

Hallux valgus with degenerative changes at first MTP joint.

No acute fracture, dislocation, or bone destruction.

Plantar calcaneal spur.

Diffuse soft tissue swelling especially at dorsum of foot.
IMPRESSION: Calcaneal spurring.

Hallux valgus with degenerative changes first MTP joint.

No acute abnormalities.

## 2018-11-27 ENCOUNTER — Ambulatory Visit: Payer: Self-pay | Admitting: Family Medicine

## 2018-11-27 NOTE — Progress Notes (Deleted)
Charlotte Simpson is a 53 y.o. female who presents to [facility] today for left foot fracture.  Charlotte Simpson suffered initial injury on January 12.  She had distal fifth metatarsal fracture and an ankle sprain.  On her last visit with me on February 6 foot was not particularly bothersome however she continued to have significant ankle pain.  The foot fracture had not healed well on x-ray at that time.  At the last visit she was transitioned to a cast with postop shoe.  We discussed transitioning also to steel turf toe insoles when able.  In the interim Charlotte Simpson notes that ***  Additionally during the last visit she was found to have a finger laceration.  As the wound was greater than 12 hours old plan to allow to heal with secondary intention.  Tetanus is up-to-date.  Patient notes in the interim that ***    ROS:  As above  Exam:  There were no vitals taken for this visit. Wt Readings from Last 5 Encounters:  11/06/18 166 lb (75.3 kg)  10/12/18 140 lb (63.5 kg)  12/26/16 164 lb (74.4 kg)  12/12/16 166 lb (75.3 kg)  11/16/16 160 lb (72.6 kg)   General: Well Developed, well nourished, and in no acute distress.  Neuro/Psych: Alert and oriented x3, extra-ocular muscles intact, able to move all 4 extremities, sensation grossly intact. Skin: Warm and dry, no rashes noted.  Respiratory: Not using accessory muscles, speaking in full sentences, trachea midline.  Cardiovascular: Pulses palpable, no extremity edema. Abdomen: Does not appear distended. MSK: ***    Lab and Radiology Results No results found for this or any previous visit (from the past 72 hour(s)). No results found.  EXAM: LEFT FOOT - COMPLETE 3+ VIEW  COMPARISON:  10/24/2018  FINDINGS: Comminuted displaced fracture of the distal LEFT fifth metatarsal again identified.  No significant callus seen.  No intra-articular extension.  Osseous mineralization normal.  No additional  fracture, dislocation, or bone destruction.  Joint spaces preserved.  Mild dorsal soft tissue swelling overlying the distal metatarsals.  IMPRESSION: Comminuted displaced distal LEFT fifth metatarsal fracture, unchanged.   Electronically Signed   By: Ulyses Southward M.D.   On: 11/06/2018 17:22 I personally (independently) visualized and performed the interpretation of the images attached in this note.    Assessment and Plan: 53 y.o. female with ***   PDMP not reviewed this encounter. No orders of the defined types were placed in this encounter.  No orders of the defined types were placed in this encounter.   Historical information moved to improve visibility of documentation.  Past Medical History:  Diagnosis Date  . ADD (attention deficit disorder)   . Anxiety   . Diabetes mellitus without complication (HCC)   . Lymphedema   . Migraines   . Neuropathy 2012  . Pancreatitis Aug 2011  . Pulmonary embolism (HCC) 10/2010  . Sciatica    Past Surgical History:  Procedure Laterality Date  . ABDOMINAL SURGERY    . BREAST SURGERY    . CHOLECYSTECTOMY    . KIDNEY STONE SURGERY    . PANCREATECTOMY    . PANCREATECTOMY     partial  . SPLENECTOMY, TOTAL     Social History   Tobacco Use  . Smoking status: Never Smoker  . Smokeless tobacco: Never Used  Substance Use Topics  . Alcohol use: No    Comment: Last drink Aug. 24, 2011. She was drinking 2-3 bottles of wine a  day.   family history includes Alcohol abuse in her father; Dementia in her maternal grandfather; Diabetes Mellitus II in her paternal grandfather; Drug abuse in her father.  Medications: Current Outpatient Medications  Medication Sig Dispense Refill  . ALPRAZolam (XANAX) 1 MG tablet Take 1 mg by mouth Daily.     Marland Kitchen amitriptyline (ELAVIL) 75 MG tablet Take 75 mg by mouth at bedtime.    . celecoxib (CELEBREX) 200 MG capsule One to 2 tablets by mouth daily as needed for pain. 60 capsule 2  . diclofenac  sodium (VOLTAREN) 1 % GEL Apply 2 g topically 4 (four) times daily. 100 g 12  . metFORMIN (GLUCOPHAGE) 500 MG tablet Take by mouth 2 (two) times daily with a meal.    . rizatriptan (MAXALT) 10 MG tablet Take 10 mg by mouth Daily.    . sertraline (ZOLOFT) 50 MG tablet Take by mouth.    . ziprasidone (GEODON) 20 MG capsule Take by mouth.     No current facility-administered medications for this visit.    Allergies  Allergen Reactions  . Acetaminophen Other (See Comments) and Swelling    Precaution... To much for liver   . Doripenem Hives      Discussed warning signs or symptoms. Please see discharge instructions. Patient expresses understanding.

## 2018-12-10 ENCOUNTER — Ambulatory Visit (INDEPENDENT_AMBULATORY_CARE_PROVIDER_SITE_OTHER): Payer: Medicare HMO

## 2018-12-10 ENCOUNTER — Encounter: Payer: Self-pay | Admitting: Family Medicine

## 2018-12-10 ENCOUNTER — Ambulatory Visit (INDEPENDENT_AMBULATORY_CARE_PROVIDER_SITE_OTHER): Payer: Medicare HMO | Admitting: Family Medicine

## 2018-12-10 ENCOUNTER — Other Ambulatory Visit: Payer: Self-pay

## 2018-12-10 VITALS — BP 112/67 | HR 83 | Ht 60.0 in | Wt 153.0 lb

## 2018-12-10 DIAGNOSIS — X58XXXD Exposure to other specified factors, subsequent encounter: Secondary | ICD-10-CM | POA: Diagnosis not present

## 2018-12-10 DIAGNOSIS — S92355D Nondisplaced fracture of fifth metatarsal bone, left foot, subsequent encounter for fracture with routine healing: Secondary | ICD-10-CM | POA: Diagnosis not present

## 2018-12-10 DIAGNOSIS — S92355A Nondisplaced fracture of fifth metatarsal bone, left foot, initial encounter for closed fracture: Secondary | ICD-10-CM | POA: Diagnosis not present

## 2018-12-10 DIAGNOSIS — S93402D Sprain of unspecified ligament of left ankle, subsequent encounter: Secondary | ICD-10-CM

## 2018-12-10 DIAGNOSIS — IMO0001 Reserved for inherently not codable concepts without codable children: Secondary | ICD-10-CM

## 2018-12-10 NOTE — Progress Notes (Signed)
Charlotte Simpson is a 53 y.o. female who presents to [facility] today for follow-up foot fracture and ankle sprain.  Charlotte Simpson has been seen several times for fracture right foot distal fifth metatarsal.  Original injury was early January 2020.  Fracture was comminuted and managed with posterior leg splint subsequent to cam walker boot and then into postop shoe.  At the last visit on February 6 the fracture had not healed but she was effectively almost completely pain-free or her only minimal pain at the distal fifth metatarsal..  She transition to conventional shoes on her own.  She notes only very minimal pain at the distal fifth metatarsal.  Ankle sprain: Charlotte Simpson also suffered an ankle sprain at the original injury date in early January 2020.  She had treatment as above culminating in ASO brace and home exercise program after the last visit.  She has started using compressive wraps which she finds to be helpful.  She feels as though she could use a little more stability.  She finds the compression helpful.   ROS:  As above  Exam:  BP 112/67   Pulse 83   Ht 5' (1.524 m)   Wt 153 lb (69.4 kg)   BMI 29.88 kg/m  Wt Readings from Last 5 Encounters:  12/10/18 153 lb (69.4 kg)  11/06/18 166 lb (75.3 kg)  10/12/18 140 lb (63.5 kg)  12/26/16 164 lb (74.4 kg)  12/12/16 166 lb (75.3 kg)   General: Well Developed, well nourished, and in no acute distress.  Neuro/Psych: Alert and oriented x3, extra-ocular muscles intact, able to move all 4 extremities, sensation grossly intact. Skin: Warm and dry, no rashes noted.  Respiratory: Not using accessory muscles, speaking in full sentences, trachea midline.  Cardiovascular: Pulses palpable, no extremity edema. Abdomen: Does not appear distended. MSK:  Left ankle and foot: Relatively normal peer without significant ankle swelling.  Mildly swollen at distal fifth metatarsal. Normal ankle motion.  Stable ligaments  exam. Mildly tender palpation distal fifth metatarsal.  Normal toe motion.  Normal gait.    Lab and Radiology Results X-ray images left foot personally independently reviewed.  Continued comminuted fracture distal fifth metatarsal with unchanged alignment.  Callus formation is present but fracture has not fully healed. Await formal radiology review    Assessment and Plan: 53 y.o. female with  Left foot distal fifth metatarsal fracture: Slowly healing.  Clinically improving.  Continue current regimen and recheck in 1 month.  Ankle sprain: Again slowly improving.  Continue home exercise program and trial of compressive ankle sleeve such as body helix.  Recheck in 1 month.   PDMP not reviewed this encounter. Orders Placed This Encounter  Procedures  . DG Foot Complete Left    Standing Status:   Future    Number of Occurrences:   1    Standing Expiration Date:   02/09/2020    Order Specific Question:   Reason for Exam (SYMPTOM  OR DIAGNOSIS REQUIRED)    Answer:   eval x    Order Specific Question:   Is patient pregnant?    Answer:   No    Order Specific Question:   Preferred imaging location?    Answer:   [facility]    Order Specific Question:   Radiology Contrast Protocol - do NOT remove file path    Answer:   \\[email or web address]   No orders of the defined types were placed in this encounter.   Historical information moved  to improve visibility of documentation.  Past Medical History:  Diagnosis Date  . ADD (attention deficit disorder)   . Anxiety   . Diabetes mellitus without complication (HCC)   . Lymphedema   . Migraines   . Neuropathy 2012  . Pancreatitis Aug 2011  . Pulmonary embolism (HCC) 10/2010  . Sciatica    Past Surgical History:  Procedure Laterality Date  . ABDOMINAL SURGERY    . BREAST SURGERY    . CHOLECYSTECTOMY    . KIDNEY STONE SURGERY    . PANCREATECTOMY    . PANCREATECTOMY     partial  .  SPLENECTOMY, TOTAL     Social History   Tobacco Use  . Smoking status: Never Smoker  . Smokeless tobacco: Never Used  Substance Use Topics  . Alcohol use: No    Comment: Last drink Aug. 24, 2011. She was drinking 2-3 bottles of wine a day.   family history includes Alcohol abuse in her father; Dementia in her maternal grandfather; Diabetes Mellitus II in her paternal grandfather; Drug abuse in her father.  Medications: Current Outpatient Medications  Medication Sig Dispense Refill  . ALPRAZolam (XANAX) 1 MG tablet Take 1 mg by mouth Daily.     Marland Kitchen amitriptyline (ELAVIL) 75 MG tablet Take 75 mg by mouth at bedtime.    . celecoxib (CELEBREX) 200 MG capsule One to 2 tablets by mouth daily as needed for pain. 60 capsule 2  . diclofenac sodium (VOLTAREN) 1 % GEL Apply 2 g topically 4 (four) times daily. 100 g 12  . metFORMIN (GLUCOPHAGE) 500 MG tablet Take by mouth 2 (two) times daily with a meal.    . rizatriptan (MAXALT) 10 MG tablet Take 10 mg by mouth Daily.    . sertraline (ZOLOFT) 50 MG tablet Take by mouth.    . ziprasidone (GEODON) 20 MG capsule Take by mouth.     No current facility-administered medications for this visit.    Allergies  Allergen Reactions  . Acetaminophen Other (See Comments) and Swelling    Precaution... To much for liver   . Doripenem Hives      Discussed warning signs or symptoms. Please see discharge instructions. Patient expresses understanding.

## 2018-12-10 NOTE — Patient Instructions (Signed)
Thank you for coming in today. Continue home exercises.  Use the Body Helix Full ankle compression sleeve (small).  Get xray on the way out.  Recheck in 1 month or sooner if needed.

## 2019-01-08 ENCOUNTER — Ambulatory Visit: Payer: Self-pay | Admitting: Family Medicine

## 2020-10-31 IMAGING — DX DG FOOT COMPLETE 3+V*L*
3 series · 3 of 3 positions shown · non-contrast
Comparison: 10/24/2018

CLINICAL DATA: Closed fracture, follow-up

EXAM:
LEFT FOOT - COMPLETE 3+ VIEW

[foot ap]
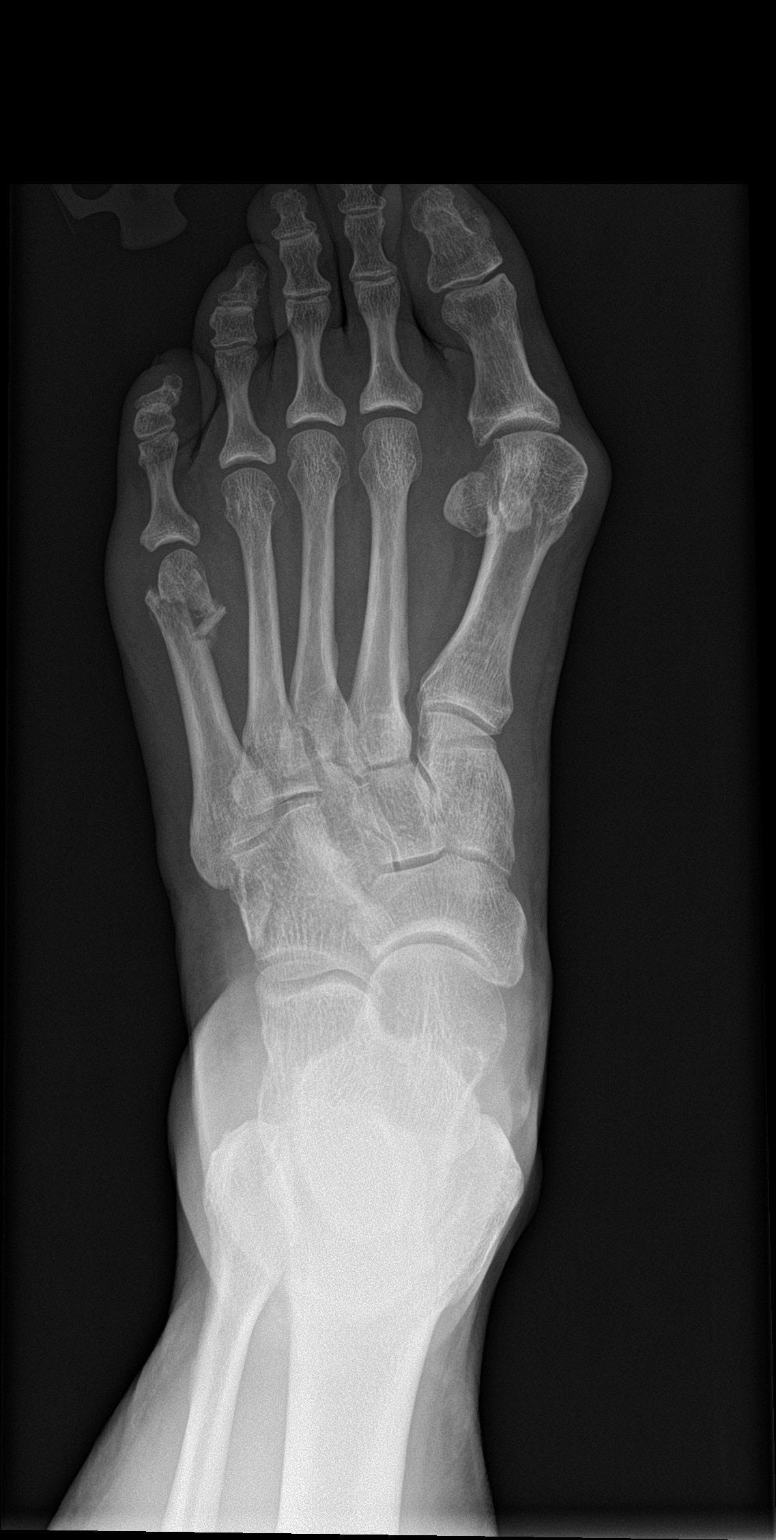

[foot obl]
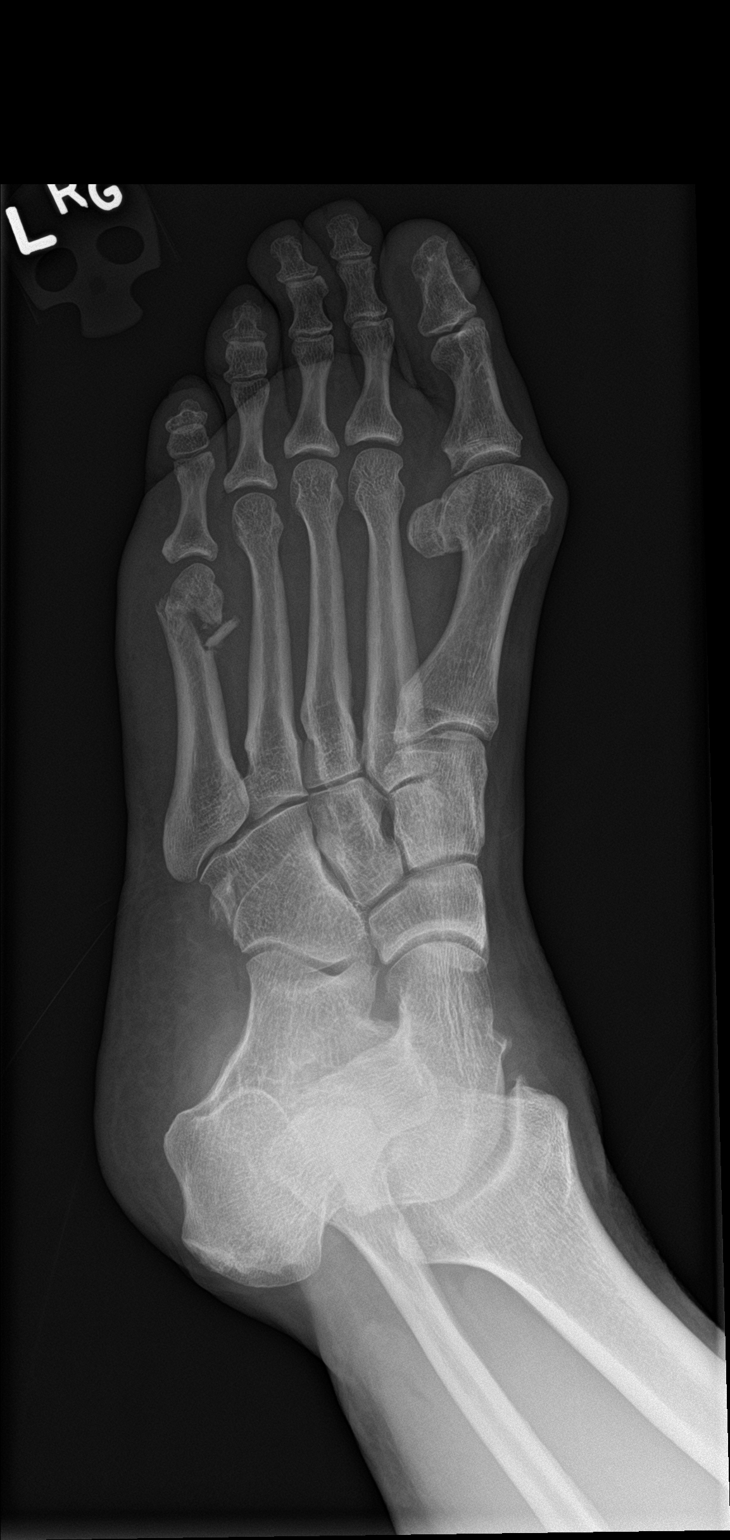

[foot lat]
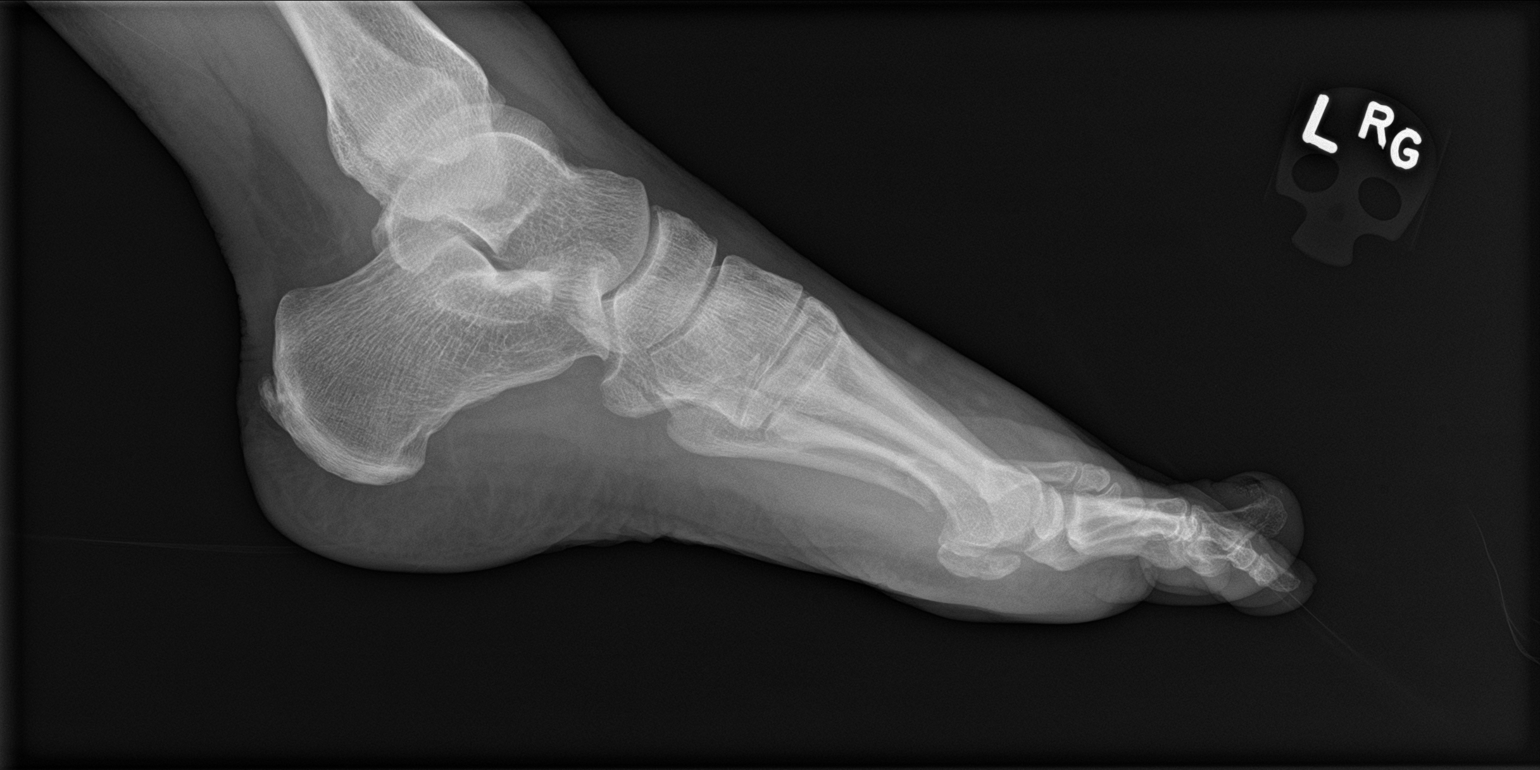

[3 of 3 positions shown; findings below may reference images not displayed]

FINDINGS: Comminuted displaced fracture of the distal LEFT fifth metatarsal
again identified.

No significant callus seen.

No intra-articular extension.

Osseous mineralization normal.

No additional fracture, dislocation, or bone destruction.

Joint spaces preserved.

Mild dorsal soft tissue swelling overlying the distal metatarsals.
IMPRESSION: Comminuted displaced distal LEFT fifth metatarsal fracture,
unchanged.

## 2024-08-31 ENCOUNTER — Encounter: Payer: Self-pay | Admitting: Podiatry

## 2024-08-31 ENCOUNTER — Ambulatory Visit: Admitting: Podiatry

## 2024-08-31 DIAGNOSIS — L03032 Cellulitis of left toe: Secondary | ICD-10-CM

## 2024-08-31 NOTE — Patient Instructions (Signed)

## 2024-09-02 NOTE — Progress Notes (Signed)
 Subjective:   Patient ID: Charlotte Simpson, female   DOB: 58 y.o.   MRN: [id number]   HPI Patient states that she traumatized her left big toenail a number of years ago and has had off-and-on issues with it and it is turned very red and irritated and sore and she did start antibiotics with mild improvement but still has redness and is taking a trip within a week.  Does have diabetes under reasonably good control and does not smoke likes to be active   Review of Systems  All other systems reviewed and are negative.       Objective:  Physical Exam Vitals and nursing note reviewed.  Constitutional:      Appearance: She is well-developed.  Pulmonary:     Effort: Pulmonary effort is normal.  Musculoskeletal:        General: Normal range of motion.  Skin:    General: Skin is warm.  Neurological:     Mental Status: She is alert.     Neurovascular status was found to be intact muscle strength was found to be adequate range of motion adequate with patient noted to have damaged left hallux nailbed dystrophic in the proximal portion with redness and pain across the entire plantar nail surface.  Patient has good digital perfusion well oriented x 3     Assessment:  Appears to be a significant localized paronychia infection affecting both borders and the central portion of the nailbed     Plan:  H&P condition reviewed discussed antibiotics to be finished and I went ahead and anesthetized the left big toe I removed the medial lateral side 2 separate procedures found there still to be some redness in the central and remove the rest of the nail central and flushed it out applied sterile dressing instructed on soaks and wear dressing for at least 24 hours.  Then bandage usage will be seen back if symptoms occur and if any increased redness drainage swelling she is to let us  know immediately or go to the emergency room
